# Patient Record
Sex: Female | Born: 1995 | Race: White | Hispanic: No | Marital: Married | Smoking: Current every day smoker
Health system: Southern US, Community
[De-identification: ages and names within clinical notes are randomized; demographics above are authoritative.]

## PROBLEM LIST (undated history)

## (undated) DIAGNOSIS — F32A Depression, unspecified: Secondary | ICD-10-CM

## (undated) DIAGNOSIS — M419 Scoliosis, unspecified: Secondary | ICD-10-CM

## (undated) DIAGNOSIS — F419 Anxiety disorder, unspecified: Secondary | ICD-10-CM

## (undated) HISTORY — PX: WISDOM TOOTH EXTRACTION: SHX21

## (undated) HISTORY — DX: Anxiety disorder, unspecified: F41.9

## (undated) HISTORY — DX: Depression, unspecified: F32.A

## (undated) HISTORY — PX: DENTAL SURGERY: SHX609

## (undated) HISTORY — DX: Scoliosis, unspecified: M41.9

---
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
## 2013-01-23 ENCOUNTER — Ambulatory Visit: Payer: Medicaid Other | Admitting: Family Medicine

## 2013-02-06 ENCOUNTER — Emergency Department (HOSPITAL_COMMUNITY)
Admission: EM | Admit: 2013-02-06 | Discharge: 2013-02-06 | Disposition: A | Discharge status: Home / Self Care | Payer: Medicaid Other | Attending: Emergency Medicine | Admitting: Emergency Medicine

## 2013-02-06 ENCOUNTER — Emergency Department (HOSPITAL_COMMUNITY): Payer: Medicaid Other

## 2013-02-06 ENCOUNTER — Encounter (HOSPITAL_COMMUNITY): Payer: Self-pay

## 2013-02-06 DIAGNOSIS — S93602A Unspecified sprain of left foot, initial encounter: Secondary | ICD-10-CM

## 2013-02-06 DIAGNOSIS — Y939 Activity, unspecified: Secondary | ICD-10-CM | POA: Insufficient documentation

## 2013-02-06 DIAGNOSIS — S93409A Sprain of unspecified ligament of unspecified ankle, initial encounter: Secondary | ICD-10-CM | POA: Insufficient documentation

## 2013-02-06 DIAGNOSIS — Z8739 Personal history of other diseases of the musculoskeletal system and connective tissue: Secondary | ICD-10-CM | POA: Insufficient documentation

## 2013-02-06 DIAGNOSIS — X500XXA Overexertion from strenuous movement or load, initial encounter: Secondary | ICD-10-CM | POA: Insufficient documentation

## 2013-02-06 DIAGNOSIS — Y929 Unspecified place or not applicable: Secondary | ICD-10-CM | POA: Insufficient documentation

## 2013-02-06 NOTE — ED Notes (Signed)
Twisted lt foot when going down steps on Sunday.  Swelling present.  Says ankle was swollen , but is better. Ankle is nontender at present.

## 2013-02-06 NOTE — ED Notes (Signed)
Pt reports stepping from a step "wrong" 2 days ago and injured her left foot.  Swelling to the ankle and top of her foot.

## 2013-02-10 NOTE — ED Provider Notes (Signed)
  CSN: [id number]     Arrival date & time 02/06/13  1418 History     First MD Initiated Contact with Patient 02/06/13 1537     Chief Complaint  Patient presents with  . Foot Pain   (Consider location/radiation/quality/duration/timing/severity/associated sxs/prior Treatment) HPI Comments: Tammy Parks is a 17 y.o. Female presenting with pain across her foot after tripping and twisting the foot and ankle 2 days ago.  She has applied ice and has tried to elevate the extremity but with persistent pain and swelling.  She denies radiation of pain which is constant and sore with weight bearing and touch.  She denies other injury.     The history is provided by the patient.    Past Medical History  Diagnosis Date  . Scoliosis    History reviewed. No pertinent past surgical history. No family history on file. History  Substance Use Topics  . Smoking status: Never Smoker   . Smokeless tobacco: Not on file  . Alcohol Use: No   OB History   Grav Para Term Preterm Abortions TAB SAB Ect Mult Living                 Review of Systems  Musculoskeletal: Positive for joint swelling and arthralgias.  Skin: Negative for wound.  Neurological: Negative for weakness and numbness.    Allergies  Review of patient's allergies indicates no known allergies.  Home Medications   Current Outpatient Rx  Name  Route  Sig  Dispense  Refill  . etonogestrel (NEXPLANON) 68 MG IMPL implant   Subcutaneous   Inject 1 each into the skin once.         Marland Kitchen ibuprofen (ADVIL,MOTRIN) 200 MG tablet   Oral   Take 200 mg by mouth every 6 (six) hours as needed for pain.          BP 131/81  Pulse 70  Temp(Src) 98.4 F (36.9 C) (Oral)  Resp 16  Ht 5\' 2"  (1.575 m)  Wt 115 lb (52.164 kg)  BMI 21.03 kg/m2  SpO2 100% Physical Exam  Nursing note and vitals reviewed. Constitutional: She appears well-developed and well-nourished.  HENT:  Head: Normocephalic.  Cardiovascular: Normal rate and intact  distal pulses.  Exam reveals no decreased pulses.   Pulses:      Dorsalis pedis pulses are 2+ on the right side, and 2+ on the left side.       Posterior tibial pulses are 2+ on the right side, and 2+ on the left side.  Musculoskeletal: She exhibits edema and tenderness.       Left foot: She exhibits bony tenderness and swelling. She exhibits normal capillary refill, no crepitus and no deformity.       Feet:  No ankle mortise pain,  nontender at lateral and medial malleolus.  Most tender midfoot with edema appreciated at this site.  Achilles intact,  nontender.  Neurological: She is alert. No sensory deficit.  Skin: Skin is warm, dry and intact.    ED Course   Procedures (including critical care time)  Labs Reviewed - No data to display No results found. 1. Foot sprain, left, initial encounter     MDM  Patients labs and/or radiological studies were viewed and considered during the medical decision making and disposition process. Pt was encouraged to continue RICE, given crutches,  aso for support.  Plan f/u with pcp in 1 week for recheck if not improving.  Burgess Amor, PA-C 02/10/13 [phone]

## 2013-02-12 NOTE — ED Provider Notes (Signed)
Medical screening examination/treatment/procedure(s) were performed by non-physician practitioner and as supervising physician I was immediately available for consultation/collaboration. Devoria Albe, MD, FACEP   Ward Givens, MD 02/12/13 [phone]

## 2013-03-14 ENCOUNTER — Encounter: Payer: Self-pay | Admitting: Family Medicine

## 2013-03-14 ENCOUNTER — Other Ambulatory Visit (HOSPITAL_COMMUNITY)
Admission: RE | Admit: 2013-03-14 | Discharge: 2013-03-14 | Disposition: A | Discharge status: Home / Self Care | Payer: Medicaid Other | Source: Ambulatory Visit | Attending: Family Medicine | Admitting: Family Medicine

## 2013-03-14 ENCOUNTER — Ambulatory Visit (INDEPENDENT_AMBULATORY_CARE_PROVIDER_SITE_OTHER): Payer: Medicaid Other | Admitting: Family Medicine

## 2013-03-14 VITALS — BP 126/73 | HR 53 | Temp 98.1°F | Wt 120.0 lb

## 2013-03-14 DIAGNOSIS — Z2089 Contact with and (suspected) exposure to other communicable diseases: Secondary | ICD-10-CM

## 2013-03-14 DIAGNOSIS — N898 Other specified noninflammatory disorders of vagina: Secondary | ICD-10-CM | POA: Insufficient documentation

## 2013-03-14 DIAGNOSIS — M419 Scoliosis, unspecified: Secondary | ICD-10-CM

## 2013-03-14 DIAGNOSIS — Z113 Encounter for screening for infections with a predominantly sexual mode of transmission: Secondary | ICD-10-CM | POA: Insufficient documentation

## 2013-03-14 DIAGNOSIS — M4120 Other idiopathic scoliosis, site unspecified: Secondary | ICD-10-CM

## 2013-03-14 DIAGNOSIS — Z202 Contact with and (suspected) exposure to infections with a predominantly sexual mode of transmission: Secondary | ICD-10-CM

## 2013-03-14 LAB — POCT WET PREP (WET MOUNT): Clue Cells Wet Prep Whiff POC: POSITIVE

## 2013-03-14 LAB — POCT URINE PREGNANCY: Preg Test, Ur: NEGATIVE

## 2013-03-14 NOTE — Assessment & Plan Note (Signed)
Patient with previous referral to sports medicine at [facility] for evaluation. Was unable to make the appointment. Will make her another appointment.

## 2013-03-14 NOTE — Assessment & Plan Note (Addendum)
Patient with BV, no STIs. Will treat with flagyl. Also wants nexplanon out, will have return for removal within the next week. Given contraceptive handout to choose from.

## 2013-03-14 NOTE — Progress Notes (Signed)
  Subjective:    Patient ID: Tammy Parks, female    DOB: 1995-12-04, 17 y.o.   MRN: [id number]  Vaginal Discharge The patient's primary symptoms include a vaginal discharge.    VAGINAL DISCHARGE  Onset: for the past few weeks Description: white  Odor: no Itching: no  Symptoms Dysuria: no  Bleeding: yes, has been constant since got explanon placed  Pelvic pain: yes, feels like a period cramp  Back pain: no  Fever: no  Genital sores: no  Rash: no  Dyspareunia: yes, right when they initiate intercourse  No use of condoms. Last sexually active a couple of days ago.  Red Flags: Missed period: no, last LMP last week Pregnancy: no  Recent antibiotics: no  Sexual activity: yes  Possible STD exposure: states partner got tested and everything was ok  IUD: no  Diabetes: no   Scoliosis: pain getting worse in the area of her curvature. Has been doing the exercises that Dr. Hulen Luster recommended and doesn't think they are helping. Was supposed to go see ortho at [facility], but didn't go b/c of work obligation. States usually does not hurt unless has been standing for a long time.   Review of Systems  Genitourinary: Positive for vaginal discharge.   see HPI     Objective:   Physical Exam  Constitutional: She appears well-developed and well-nourished.  HENT:  Head: Normocephalic and atraumatic.  Cardiovascular: Normal rate, regular rhythm and normal heart sounds.   Pulmonary/Chest: Effort normal and breath sounds normal.  Abdominal: Soft. She exhibits no distension. There is no tenderness. There is no rebound and no guarding.  Genitourinary: Uterus normal. Vaginal discharge (white, creamy) found.  No lesions, no bleeding, minimal white creamy discharge noted  Neurological: She is alert.  Skin: Skin is warm and dry.  BP 126/73  Pulse 53  Temp(Src) 98.1 F (36.7 C) (Oral)  Wt 120 lb (54.432 kg)  BMI 21.94 kg/m2  LMP 03/05/2013     Assessment & Plan:

## 2013-03-14 NOTE — Patient Instructions (Signed)
Nice to meet you today. We will call with the results of your tests. We will also schedule an appointment for you to be seen by the orthopedic doctors at [facility] as Dr. Hulen Luster wanted. We will also see you back soon for removal of your nexplanon.

## 2013-03-15 MED ORDER — METRONIDAZOLE 500 MG PO TABS: 500.0000 mg | ORAL_TABLET | Freq: Two times a day (BID) | ORAL | Status: DC

## 2013-03-15 NOTE — Addendum Note (Signed)
Addended by: Glori Luis on: 03/15/2013 08:34 AM   Modules accepted: Orders

## 2013-03-30 ENCOUNTER — Ambulatory Visit: Payer: Medicaid Other | Admitting: Family Medicine

## 2013-08-31 ENCOUNTER — Encounter: Payer: Self-pay | Admitting: Family Medicine

## 2013-08-31 ENCOUNTER — Telehealth: Payer: Self-pay | Admitting: *Deleted

## 2013-08-31 ENCOUNTER — Ambulatory Visit (INDEPENDENT_AMBULATORY_CARE_PROVIDER_SITE_OTHER): Payer: 59 | Admitting: Family Medicine

## 2013-08-31 VITALS — BP 123/76 | HR 67 | Temp 98.4°F | Ht 62.0 in | Wt 114.0 lb

## 2013-08-31 DIAGNOSIS — R55 Syncope and collapse: Secondary | ICD-10-CM

## 2013-08-31 DIAGNOSIS — R21 Rash and other nonspecific skin eruption: Secondary | ICD-10-CM

## 2013-08-31 MED ORDER — MUPIROCIN 2 % EX OINT: 1.0000 "application " | TOPICAL_OINTMENT | Freq: Two times a day (BID) | CUTANEOUS | Status: DC

## 2013-08-31 MED ORDER — TRIAMCINOLONE ACETONIDE 0.05 % EX OINT: TOPICAL_OINTMENT | CUTANEOUS | Status: DC

## 2013-08-31 NOTE — Telephone Encounter (Signed)
[facility]amcinol[facility]int.  The Rx was prescribed for triamcinolone 0.05% oint, per [facility] it only comes in 0.5%.  Call [facility] at [phone] to clarify the strength of this oint.  Clovis PuMartin, Renee Erb L, RN

## 2013-08-31 NOTE — Assessment & Plan Note (Signed)
Likely contact dermatitis w/ possible impetigo Pt to stop wearing belt buckles, and use hypoallergenic lotions and soaps Diffuse involvement of arms and legs is mild but may be from overall inflammatory reaction Kenolog .05 oint Mupirocin F/u in 2 wks

## 2013-08-31 NOTE — Progress Notes (Signed)
Tammy Parks is a 18 y.o. female who [facility] for [facility]zy spells and rash  Dizzy spells: happened 2 days ago for the first time. Felt hot and sweatty while wearing jacket in house. Took off jacket and 15 min later felt vertigo and room went white. Friend helped pt to lie down. Gave crackers and soda and resolved after 30 min of rest.  Went to gym the night before and only had water and chicken noodle soup that night and then just water in the morning. Episode happened around 12o'clock in the afternoon. Pt drinks more than 12 cans or 3 2 L bottles of Mtn Dew per day.   Rash: present for 4 mo. Bleeding w/ scratching. Getting bigger. Denies any changes in soaps detergents, lotions. Tried using Hydrocortisone 2.5% cream from 2011 w/o benefit. Present on bellyk , elbows and both legs. Itchy.    The following portions of the patient's history were reviewed and updated as appropriate: allergies, current medications, past medical history, family and social history, and problem list.  Patient is a nonsmoker.  Past Medical History  Diagnosis Date  . Scoliosis     ROS as above otherwise neg.    Medications reviewed. Current Outpatient Prescriptions  Medication Sig Dispense Refill  . etonogestrel (NEXPLANON) 68 MG IMPL implant Inject 1 each into the skin once.      Marland Kitchen. ibuprofen (ADVIL,MOTRIN) 200 MG tablet Take 200 mg by mouth every 6 (six) hours as needed for pain.      . metroNIDAZOLE (FLAGYL) 500 MG tablet Take 1 tablet (500 mg total) by mouth 2 (two) times daily.  14 tablet  0  . mupirocin ointment (BACTROBAN) 2 % Place 1 application into the nose 2 (two) times daily.  22 g  0  . TRIAMCINOLONE ACETONIDE, TOP, 0.05 % OINT Apply topically twice daily for 1-2 weeks  85 g  0   No current facility-administered medications for this visit.    Exam:  BP 123/76  Pulse 67  Temp(Src) 98.4 F (36.9 C) (Oral)  Ht 5\' 2"  (1.575 m)  Wt 114 lb (51.71 kg)  BMI 20.85 kg/m2 Gen: Well NAD HEENT:  EOMI,  MMM Lungs: CTABL Nl WOB Heart: RRR no MRG Abd: NABS, NT, ND Exts: Non edematous BL  LE, warm and well perfused.  Skin: extensive skin breakdown around the belt line of the belly w/ small amount of honey crusting. Diffuse mild macular papular rash on arms and legs.  Neuro: CN 2-12 intact, rhomberg neg. Ambulation nml   No results found for this or any previous visit (from the past 72 hour(s)).  A/P (as seen in Problem list)  Skin rash Likely contact dermatitis w/ possible impetigo Pt to stop wearing belt buckles, and use hypoallergenic lotions and soaps Diffuse involvement of arms and legs is mild but may be from overall inflammatory reaction Kenolog .05 oint Mupirocin F/u in 2 wks   Near syncope Likely secondary to vasovagal response from stress, poor nutrition, and caffeine withdrawal  No evidence of cardiac arrythmia, szr, or medication involvement Pt to eat more balanced meals Pt to start cutting back on Mtn Dew F/u PRN

## 2013-08-31 NOTE — Patient Instructions (Signed)
You are doing well overall The skin rash is likely an allergic reaction that has become infected Please use the new steroid ointment on the rash for 1-2 weeks until it has resolved Please use the mupriocin ointment for the belly to clear up an infection Please remember to cut back on the mtn Dew and improve your overall nutrition Please come back in 2 weeks or sooner if needed.

## 2013-08-31 NOTE — Assessment & Plan Note (Signed)
Likely secondary to vasovagal response from stress, poor nutrition, and caffeine withdrawal  No evidence of cardiac arrythmia, szr, or medication involvement Pt to eat more balanced meals Pt to start cutting back on Mtn Dew F/u PRN

## 2013-09-14 ENCOUNTER — Ambulatory Visit: Payer: 59 | Admitting: Family Medicine

## 2014-02-22 ENCOUNTER — Encounter: Payer: Self-pay | Admitting: Family Medicine

## 2014-02-22 ENCOUNTER — Telehealth: Payer: Self-pay | Admitting: *Deleted

## 2014-02-22 ENCOUNTER — Ambulatory Visit (INDEPENDENT_AMBULATORY_CARE_PROVIDER_SITE_OTHER): Payer: 59 | Admitting: Family Medicine

## 2014-02-22 ENCOUNTER — Other Ambulatory Visit (HOSPITAL_COMMUNITY)
Admission: RE | Admit: 2014-02-22 | Discharge: 2014-02-22 | Disposition: A | Discharge status: Home / Self Care | Payer: 59 | Source: Ambulatory Visit | Attending: Family Medicine | Admitting: Family Medicine

## 2014-02-22 VITALS — BP 118/72 | HR 72 | Temp 98.5°F | Wt 116.0 lb

## 2014-02-22 DIAGNOSIS — N898 Other specified noninflammatory disorders of vagina: Secondary | ICD-10-CM

## 2014-02-22 DIAGNOSIS — Z113 Encounter for screening for infections with a predominantly sexual mode of transmission: Secondary | ICD-10-CM | POA: Insufficient documentation

## 2014-02-22 DIAGNOSIS — R300 Dysuria: Secondary | ICD-10-CM

## 2014-02-22 LAB — POCT UA - MICROSCOPIC ONLY

## 2014-02-22 LAB — POCT URINALYSIS DIPSTICK
BILIRUBIN UA: NEGATIVE
Blood, UA: NEGATIVE
GLUCOSE UA: NEGATIVE
Ketones, UA: NEGATIVE
NITRITE UA: NEGATIVE
Protein, UA: NEGATIVE
SPEC GRAV UA: 1.02
UROBILINOGEN UA: 0.2
pH, UA: 7

## 2014-02-22 LAB — POCT WET PREP (WET MOUNT): CLUE CELLS WET PREP WHIFF POC: POSITIVE

## 2014-02-22 MED ORDER — METRONIDAZOLE 500 MG PO TABS: 500.0000 mg | ORAL_TABLET | Freq: Two times a day (BID) | ORAL | Status: DC

## 2014-02-22 MED ORDER — FLUCONAZOLE 150 MG PO TABS: 150.0000 mg | ORAL_TABLET | Freq: Once | ORAL | Status: DC

## 2014-02-22 NOTE — Telephone Encounter (Signed)
Pt is aware of this. Billijo Dilling,CMA  

## 2014-02-22 NOTE — Patient Instructions (Signed)
Nice to see you. We will call with the results. If you develop fever, nausea, vomiting, worsening pain, or worsening discharge please seek medical attention.

## 2014-02-22 NOTE — Telephone Encounter (Signed)
Message copied by Henri MedalHARTSELL, JAZMIN M on Fri Feb 22, 2014 11:18 AM ------      Message from: Birdie SonsSONNENBERG, ERIC G      Created: Fri Feb 22, 2014 11:09 AM       Patient appears to have BV and yeast infection. I sent in antibiotics for the patient. I attempted to call to let her know the results and there was no answer. Could you please contact the patient to inform her of these results. Thanks. ------

## 2014-02-22 NOTE — Progress Notes (Signed)
Patient ID: Tammy Parks, female   DOB: 08-Sep-1995, 18 y.o.   MRN: [id number]  Marikay AlarEric Sonnenberg, MD Phone: [phone]  Tammy Parks is a 18 y.o. female who presents today for same day appointment.  Vaginal discharge: for the past 2 months. She notes this is thick and white. Notes she has pain with sex that is worst with insertion though is uncomfortable during intercourse. No abdominal pain. No fever or chilld. She has on sexual partner and they do not use condoms. No history of STDs. No vaginal bleeding.  Patient is a smoker.  ROS: Per HPI   Physical Exam Filed Vitals:   02/22/14 1017  BP: 118/72  Pulse: 72  Temp: 98.5 F (36.9 C)     Gen: Well NAD HEENT: PERRL,  MMM Lungs: CTABL Nl WOB Heart: RRR no MRG Abd: soft, NT, ND GU: mild irritation of labia, normal vaginal mucosa, normal appearing cervix, there is thick white discharge in vaginal vault, no discharge from the cervical os, there was discomfort on insertion of fingers for bimanual exam Exts: Non edematous BL  LE, warm and well perfused.    Assessment/Plan: Please see individual problem list.

## 2014-02-23 NOTE — Assessment & Plan Note (Signed)
Patient with BV and yeast infection on wet prep. Will treat with flagyl and diflucan. Will await results of GC/chlamydia and will inform her of the results. Given return precautions. F/u prn or if not improving.

## 2014-03-04 NOTE — Progress Notes (Signed)
Pt is aware of results. Maggi Hershkowitz,CMA  

## 2014-05-27 ENCOUNTER — Telehealth: Payer: Self-pay | Admitting: Family Medicine

## 2014-05-27 NOTE — Telephone Encounter (Signed)
Wants to know when her birth control runs out

## 2014-05-27 NOTE — Telephone Encounter (Signed)
Pt is aware of placement 10/2011.  She will be due for it to be removed 10/2014. Zaccai Chavarin,CMA

## 2014-08-08 ENCOUNTER — Ambulatory Visit (INDEPENDENT_AMBULATORY_CARE_PROVIDER_SITE_OTHER): Payer: 59 | Admitting: Family Medicine

## 2014-08-08 VITALS — BP 134/72 | HR 102 | Temp 98.1°F | Ht 62.0 in | Wt 113.7 lb

## 2014-08-08 DIAGNOSIS — Z3046 Encounter for surveillance of implantable subdermal contraceptive: Secondary | ICD-10-CM

## 2014-08-08 DIAGNOSIS — Z30017 Encounter for initial prescription of implantable subdermal contraceptive: Secondary | ICD-10-CM

## 2014-08-08 DIAGNOSIS — Z308 Encounter for other contraceptive management: Secondary | ICD-10-CM

## 2014-08-08 LAB — POCT URINE PREGNANCY: PREG TEST UR: NEGATIVE

## 2014-08-08 MED ORDER — MEDROXYPROGESTERONE ACETATE 150 MG/ML IM SUSP
150.0000 mg | Freq: Once | INTRAMUSCULAR | Status: AC
  Administered 2014-08-08: 150 mg via INTRAMUSCULAR

## 2014-08-08 NOTE — Addendum Note (Signed)
Addended by: Clovis PuMARTIN, TAMIKA L on: 08/08/2014 09:39 AM   Modules accepted: Orders

## 2014-08-08 NOTE — Patient Instructions (Signed)
Please make sure to use a back method of birth control (eg condoms) for next week.  You will need to return every 3 months for depo shot.  You may remove dressing and bathe normally after 24 hours.   Wound Care Wound care helps prevent pain and infection.  You may need a tetanus shot if:  You cannot remember when you had your last tetanus shot.  You have never had a tetanus shot.  The injury broke your skin. If you need a tetanus shot and you choose not to have one, you may get tetanus. Sickness from tetanus can be serious. HOME CARE   Only take medicine as told by your doctor.  Clean the wound daily with mild soap and water.  Change any bandages (dressings) as told by your doctor.  Put medicated cream and a bandage on the wound as told by your doctor.  Change the bandage if it gets wet, dirty, or starts to smell.  Take showers. Do not take baths, swim, or do anything that puts your wound under water.  Rest and raise (elevate) the wound until the pain and puffiness (swelling) are better.  Keep all doctor visits as told. GET HELP RIGHT AWAY IF:   Yellowish-white fluid (pus) comes from the wound.  Medicine does not lessen your pain.  There is a red streak going away from the wound.  You have a fever. MAKE SURE YOU:   Understand these instructions.  Will watch your condition.  Will get help right away if you are not doing well or get worse. Document Released: 04/13/2008 Document Revised: 09/27/2011 Document Reviewed: 11/08/2010 [facility] Patient Information 2015 [place], [place]. This information is not intended to replace advice given to you by your health care provider. Make sure you discuss any questions you have with your health care provider.

## 2014-08-08 NOTE — Progress Notes (Signed)
Patient ID: Minna AntisSamantha A Lynn, female   DOB: [birth year], 19 y.o.   MRN: [id number]   HPI: Here for Nexplanon removal, she had it placed 10/2011.  ALLERGIES  No Known Allergies      Yes (specify below) ALLERGY REACTION  __________________________________ __________________________________  __________________________________ __________________________________  __________________________________ __________________________________    REASONS FOR REMOVAL  Chief Complaint:   Vaginal bleeding       Heavy bleeding    Other:Time to remove Nexplanon  Secondary Complaints:Will like to start Depo shot.  Removal Approved By:PCP   The patient's R arm was palpated and the implant device located. The area was prepped with Betadine. The distal end of the device was palpated and 3 cc of 1% lidocaine with epinephrine was injected. A 1-2 mm incision was made. Any fibrotic tissue was carefully dissected away using blunt  dissection. The device was removed in an intact manner. Dermabond and a sterile dressing were applied to the incision. The patient tolerated the procedure well.  New contraceptive method: Depo given today.  Procedure performed by : Dr Nadine CountsGottschalk. Assistant: Dr Lum BabeEniola.

## 2014-08-08 NOTE — Progress Notes (Signed)
  Pt in for Depo Provera injection.  Pt tolerated Depo injection. Depo given left upper outer quadrant.  Next injection due April 8-November 08, 2014.  Reminder card given. Clovis PuMartin, Phallon Haydu L, RN

## 2014-10-29 ENCOUNTER — Ambulatory Visit (INDEPENDENT_AMBULATORY_CARE_PROVIDER_SITE_OTHER): Payer: 59 | Admitting: *Deleted

## 2014-10-29 DIAGNOSIS — Z3042 Encounter for surveillance of injectable contraceptive: Secondary | ICD-10-CM | POA: Diagnosis not present

## 2014-10-29 MED ORDER — MEDROXYPROGESTERONE ACETATE 150 MG/ML IM SUSP
150.0000 mg | Freq: Once | INTRAMUSCULAR | Status: AC
  Administered 2014-10-29: 150 mg via INTRAMUSCULAR

## 2014-10-29 NOTE — Progress Notes (Signed)
   Pt in for Depo Provera injection.  Pt tolerated Depo injection. Depo given right upper outer quadrant.  Next injection due June 25-January 28, 2015.  Reminder card given. Clovis PuMartin, Tamika L, RN

## 2015-01-14 ENCOUNTER — Ambulatory Visit (INDEPENDENT_AMBULATORY_CARE_PROVIDER_SITE_OTHER): Payer: 59 | Admitting: *Deleted

## 2015-01-14 DIAGNOSIS — Z3042 Encounter for surveillance of injectable contraceptive: Secondary | ICD-10-CM

## 2015-01-14 MED ORDER — MEDROXYPROGESTERONE ACETATE 150 MG/ML IM SUSP
150.0000 mg | Freq: Once | INTRAMUSCULAR | Status: AC
  Administered 2015-01-14: 150 mg via INTRAMUSCULAR

## 2015-01-14 NOTE — Progress Notes (Signed)
   Pt in for Depo Provera injection.  Pt tolerated Depo injection. Depo given left upper outer quadrant.  Next injection due Sept 13-Sept 27, 2016.  Pt has been bleeding continuously since last injection given.  Precept with Dr. Perley JainMcDiarmid; pt need to be seen by a provider for further evaluation.  Appt with PCP 01/15/15 at 3:15 PM.  Pt will like to continue with Depo Provera. Reminder card given. Clovis PuMartin, Yatzary Merriweather L, RN

## 2015-01-15 ENCOUNTER — Other Ambulatory Visit (HOSPITAL_COMMUNITY)
Admission: RE | Admit: 2015-01-15 | Discharge: 2015-01-15 | Disposition: A | Discharge status: Home / Self Care | Payer: 59 | Source: Ambulatory Visit | Attending: Family Medicine | Admitting: Family Medicine

## 2015-01-15 ENCOUNTER — Encounter: Payer: Self-pay | Admitting: Family Medicine

## 2015-01-15 ENCOUNTER — Ambulatory Visit (INDEPENDENT_AMBULATORY_CARE_PROVIDER_SITE_OTHER): Payer: 59 | Admitting: Family Medicine

## 2015-01-15 VITALS — BP 123/81 | HR 89 | Temp 98.4°F | Ht 62.0 in | Wt 114.0 lb

## 2015-01-15 DIAGNOSIS — N926 Irregular menstruation, unspecified: Secondary | ICD-10-CM

## 2015-01-15 DIAGNOSIS — R55 Syncope and collapse: Secondary | ICD-10-CM

## 2015-01-15 DIAGNOSIS — R42 Dizziness and giddiness: Secondary | ICD-10-CM

## 2015-01-15 DIAGNOSIS — N939 Abnormal uterine and vaginal bleeding, unspecified: Secondary | ICD-10-CM

## 2015-01-15 DIAGNOSIS — Z113 Encounter for screening for infections with a predominantly sexual mode of transmission: Secondary | ICD-10-CM | POA: Insufficient documentation

## 2015-01-15 LAB — POCT WET PREP (WET MOUNT): Clue Cells Wet Prep Whiff POC: NEGATIVE

## 2015-01-15 LAB — POCT URINE PREGNANCY: PREG TEST UR: NEGATIVE

## 2015-01-15 NOTE — Patient Instructions (Addendum)
Nice to see you.  Your bleeding is likely related to the depo provera. We are going to check some labs and will call with the results.  Please drink plenty of water. Decrease caffeine intake.  If you develop further symptoms of dizziness, have chest pain, trouble breathing, continue to bleed, get light headed or have palpitations please seek medical attention.

## 2015-01-15 NOTE — Assessment & Plan Note (Addendum)
Given prodrome of chills suspect this is a vasovagal reaction. Orthostatics negative. Dix halpike negative. Neurologically intact. No cardiac symptoms with this so doubt arrhythmia as a cause. Advised on continuing to cut back on caffeine. Advised to drink an adequate amount of water. Will check BMET to evaluate for glucose and electrolyte abnormalities. Given return precautions. Will continue to monitor. If recurs she will follow-up.

## 2015-01-15 NOTE — Assessment & Plan Note (Signed)
Patient with several months of intermittent uterine bleeding likely related to recent start of depo provera. She is not bleeding at this time. HR stable so doubt she is anemic. No tenderness on exam. No appreciated lesions on exam. Given likely old blood that was dark in appearance in vagina wet prep and GC/chlamydia checked to rule out infectious cause of this material. Doubt infection, though patient sexually active with no condom use. U preg negative. Will check CBC. Await lab results. Given return precautions.

## 2015-01-15 NOTE — Progress Notes (Addendum)
Patient ID: Tammy Parks, female   DOB: January 02, 1996, 19 y.o.   MRN: [id number]  Tammy AlarEric Makinzi Prieur, MD Phone: [phone]  Tammy Parks is a 19 y.o. female who presents today for f/u.  Abnormal uterine bleeding: patient notes that she has had abnormal bleeding for the past 3 months. She notes starting depo 6 months ago. After the first shot she had no bleeding for 3 months. After the 2nd shot she had intermittent bleeding alternating between normal periods and spotting. Had bleeding most days for 3 months. She was seen yesterday for 3rd injection and notes that she had decreased bleeding yesterday and no bleeding today. She notes bleeding was dark in nature like clots. She denies abdominal pain with this. She is sexually active with one partner and does not use condoms. Has had similar issues in the past when she had nexplanon in place, though the bleeding was worse at that time. Has not had normal periods since the nexplanon was placed 4 years ago.  Vertigo: patient notes one episode of vertigo one month ago. Describes as the room spinning around her. Lasted for about 10 minutes. No chest pain, shortness of breath, palpitations, numbness, weakness, or neurological changes with this. Notes she does get chills prior to this. Occurred when sitting down. Resolved on its own. Notes similar episode several years ago. Notes she has decreased caffeine intake, though still drink some caffeine. Does not drink much water.   PMH: nonsmoker.   ROS: Per HPI   Physical Exam Filed Vitals:   01/15/15 1524  BP: 123/81  Pulse: 89  Temp: 98.4 F (36.9 C)    Gen: Well NAD HEENT: PERRL,  MMM, normal TMs bilaterally, normal OP Lungs: CTABL Nl WOB Heart: RRR, no murmur appreciated Abd: soft, NT, ND GU: normal labia, normal vaginal mucosa, there is old blood noted in the vagina, no blood from the cervical os, no defects seen on speculum exam, no vaginal wall defects palpated on bimanual exam, no adnexal  masses or tenderness appreciated on exam Neuro: CN 2-12 intact, 5/5 strength in bilateral biceps, triceps, grip, quads, hamstrings, plantar and dorsiflexion, sensation to light touch intact in bilateral UE and LE, normal gait, 2+ patellar reflexes Exts: Non edematous BL  LE, warm and well perfused.    Assessment/Plan: Please see individual problem list.  Near syncope Given prodrome of chills suspect this is a vasovagal reaction. Orthostatics negative. Dix halpike negative. Neurologically intact. No cardiac symptoms with this so doubt arrhythmia as a cause. Advised on continuing to cut back on caffeine. Advised to drink an adequate amount of water. Will check BMET to evaluate for glucose and electrolyte abnormalities. Given return precautions. Will continue to monitor. If recurs she will follow-up.  Abnormal uterine bleeding Patient with several months of intermittent uterine bleeding likely related to recent start of depo provera. She is not bleeding at this time. HR stable so doubt she is anemic. No tenderness on exam. No appreciated lesions on exam. Given likely old blood that was dark in appearance in vagina wet prep and GC/chlamydia checked to rule out infectious cause of this material. Doubt infection, though patient sexually active with no condom use. U preg negative. Will check CBC. Await lab results. Given return precautions.     Orders Placed This Encounter  Procedures  . CBC  . Basic Metabolic Panel  . POCT urine pregnancy  . POCT Wet Prep [facility] [place])    Tammy AlarEric Jaxn Chiquito, MD [facility] PGY[facility]

## 2015-01-16 ENCOUNTER — Telehealth: Payer: Self-pay | Admitting: Family Medicine

## 2015-01-16 DIAGNOSIS — B379 Candidiasis, unspecified: Secondary | ICD-10-CM

## 2015-01-16 LAB — BASIC METABOLIC PANEL
BUN: 11 mg/dL (ref 6–23)
CALCIUM: 9.1 mg/dL (ref 8.4–10.5)
CO2: 24 mEq/L (ref 19–32)
CREATININE: 0.82 mg/dL (ref 0.50–1.10)
Chloride: 108 mEq/L (ref 96–112)
GLUCOSE: 82 mg/dL (ref 70–99)
POTASSIUM: 3.9 meq/L (ref 3.5–5.3)
Sodium: 142 mEq/L (ref 135–145)

## 2015-01-16 LAB — CBC
HCT: 43.8 % (ref 36.0–46.0)
HEMOGLOBIN: 15.3 g/dL — AB (ref 12.0–15.0)
MCH: 31 pg (ref 26.0–34.0)
MCHC: 34.9 g/dL (ref 30.0–36.0)
MCV: 88.7 fL (ref 78.0–100.0)
MPV: 9.6 fL (ref 8.6–12.4)
Platelets: 266 10*3/uL (ref 150–400)
RBC: 4.94 MIL/uL (ref 3.87–5.11)
RDW: 12.7 % (ref 11.5–15.5)
WBC: 6.6 10*3/uL (ref 4.0–10.5)

## 2015-01-16 LAB — CERVICOVAGINAL ANCILLARY ONLY
Chlamydia: NEGATIVE
Neisseria Gonorrhea: NEGATIVE

## 2015-01-16 MED ORDER — FLUCONAZOLE 150 MG PO TABS: 150.0000 mg | ORAL_TABLET | ORAL | Status: DC

## 2015-01-16 NOTE — Telephone Encounter (Signed)
Attempted to call the patient. Went straight to VM. Left message asking her to call back to the office. When she call back please advise her that her lab results showed a yeast infection. No other infections present. I will send in a prescription for diflucan.

## 2015-01-17 MED ORDER — FLUCONAZOLE 150 MG PO TABS: 150.0000 mg | ORAL_TABLET | ORAL | Status: DC

## 2015-01-17 NOTE — Telephone Encounter (Signed)
Spoke with patient and she is aware of results.  She would like this medication resent to [facility] in [place].  Informed her that this has been done and she can pick up later today.  Genene Kilman,CMA

## 2015-01-17 NOTE — Addendum Note (Signed)
Addended by: Henri MedalHARTSELL, Sian Rockers M on: 01/17/2015 10:09 AM   Modules accepted: Orders

## 2015-02-13 ENCOUNTER — Ambulatory Visit: Payer: 59 | Admitting: Family Medicine

## 2015-04-08 ENCOUNTER — Ambulatory Visit: Payer: 59

## 2015-09-16 ENCOUNTER — Ambulatory Visit (INDEPENDENT_AMBULATORY_CARE_PROVIDER_SITE_OTHER): Payer: 59 | Admitting: Family Medicine

## 2015-09-16 ENCOUNTER — Encounter: Payer: Self-pay | Admitting: Family Medicine

## 2015-09-16 VITALS — BP 135/78 | HR 94 | Temp 97.7°F | Ht 62.0 in | Wt 112.0 lb

## 2015-09-16 DIAGNOSIS — H00016 Hordeolum externum left eye, unspecified eyelid: Secondary | ICD-10-CM | POA: Diagnosis not present

## 2015-09-16 NOTE — Patient Instructions (Signed)
Continue warm compresses. I don't think you need an antibiotic right now, If gets worse or vision changes come back to see [place]  Be well, Dr. Ellison Carwin A stye is a bump on your eyelid caused by a bacterial infection. A stye can form inside the eyelid (internal stye) or outside the eyelid (external stye). An internal stye may be caused by an infected oil-producing gland inside your eyelid. An external stye may be caused by an infection at the base of your eyelash (hair follicle). Styes are very common. Anyone can get them at any age. They usually occur in just one eye, but you may have more than one in either eye.  CAUSES  The infection is almost always caused by bacteria called Staphylococcus aureus. This is a common type of bacteria that lives on your skin. RISK FACTORS You may be at higher risk for a stye if you have had one before. You may also be at higher risk if you have:  Diabetes.  Long-term illness.  Long-term eye redness.  A skin condition called seborrhea.  High fat levels in your blood (lipids). SIGNS AND SYMPTOMS  Eyelid pain is the most common symptom of a stye. Internal styes are more painful than external styes. Other signs and symptoms may include:  Painful swelling of your eyelid.  A scratchy feeling in your eye.  Tearing and redness of your eye.  Pus draining from the stye. DIAGNOSIS  Your health care provider may be able to diagnose a stye just by examining your eye. The health care provider may also check to make sure:  You do not have a fever or other signs of a more serious infection.  The infection has not spread to other parts of your eye or areas around your eye. TREATMENT  Most styes will clear up in a few days without treatment. In some cases, you may need to use antibiotic drops or ointment to prevent infection. Your health care provider may have to drain the stye surgically if your stye is:  Large.  Causing a lot of pain.  Interfering  with your vision. This can be done using a thin blade or a needle.  HOME CARE INSTRUCTIONS   Take medicines only as directed by your health care provider.  Apply a clean, warm compress to your eye for 10 minutes, 4 times a day.  Do not wear contact lenses or eye makeup until your stye has healed.  Do not try to pop or drain the stye. SEEK MEDICAL CARE IF:  You have chills or a fever.  Your stye does not go away after several days.  Your stye affects your vision.  Your eyeball becomes swollen, red, or painful. MAKE SURE YOU:  Understand these instructions.  Will watch your condition.  Will get help right away if you are not doing well or get worse.   This information is not intended to replace advice given to you by your health care provider. Make sure you discuss any questions you have with your health care provider.   Document Released: 04/14/2005 Document Revised: 07/26/2014 Document Reviewed: 10/19/2013 Elsevier Interactive Patient Education [employer]

## 2015-09-18 NOTE — Progress Notes (Signed)
Date of Visit: 09/16/2015   HPI:  Patient presents for a same day appointment to discuss left eyelid swelling. Present starting 2 days ago. Eyelid was swollen worse then but has improved. Has been using warm compress. Has occasionally been able to squeeze small mount of green material out of eyelid. Otherwise no drainage. No fever, vision changes. Does not wear contacts. Pain when she pushes on eyelid, otherwise no pain. Eye not red.   ROS: See HPI  PMFSH: history scoliosis  PHYSICAL EXAM: BP 135/78 mmHg  Pulse 94  Temp(Src) 97.7 F (36.5 C) (Oral)  Ht  (1.575 m)  Wt 112 lb (50.803 kg)  BMI 20.48 kg/m2  LMP 09/09/2015  Visual acuity: L 20/20  R 20/20  B 20/20  Gen: NAD, pleasant, cooperative HEENT: normocephalic, atraumatic. pupils equal round and reactive to light. extraocular movements intact. Sclera without injection bilaterally. Mild erythema to L eyelid. Small hordeolum present along lid margin. No drainage. Moist mucous membranes   ASSESSMENT/PLAN:  1. Hordeolum - recommend supportive care with continued warm compresses. Reassuring that this has improved with conservative management. Follow up if not continuing to improve. Handout given. Patient appreciative and agreeable.   FOLLOW UP: Follow up as needed if symptoms worsen or fail to improve.    [place] J. Pollie Meyer, MD [facility]

## 2016-07-19 NOTE — L&D Delivery Note (Addendum)
Delivery Note  Patient presented w/ SROM. Protracted second stage, pushed for approximately 5 hours total. Maternal exhaustion, unable to push past plus 2 station. Risks/benefits of vacuum-assisted delivery discussed with patient including the rare but severe complication of ICH, shared decision to proceed w/ vacuum-assisted delivery. Gentle traction applied for three contractions, no pop-offs or loss of suction. Additional staff members including NICU were present for the delivery. Vacuum removed after delivery of fetal vertex; remainder of delivery proceeded without complication. Minimal respiratory effort after delivery, so after approximately 30 seconds decision made to clamp and cut cord and transfer to warmer for evaluation by NICU team.  At 9:19 AM a viable female was delivered via Vaginal, Spontaneous (Presentation: OA ).  APGAR: pending ; weight pending .   Placenta status: intact.  Cord: 3v. with the following complications: vacuum-assisted delivery.  Cord pH: pending  Anesthesia:  epidural Episiotomy: None Lacerations: 1st degree Suture Repair: 3.0 monocryl Est. Blood Loss (mL):  250  Mom to postpartum.  Baby to Couplet care / Skin to Skin.  Tammy Parks 06/29/2017, 9:34 AM

## 2016-07-27 ENCOUNTER — Ambulatory Visit (INDEPENDENT_AMBULATORY_CARE_PROVIDER_SITE_OTHER): Payer: 59 | Admitting: Internal Medicine

## 2016-07-27 ENCOUNTER — Encounter: Payer: Self-pay | Admitting: Internal Medicine

## 2016-07-27 DIAGNOSIS — B86 Scabies: Secondary | ICD-10-CM | POA: Insufficient documentation

## 2016-07-27 MED ORDER — HYDROCORTISONE 2.5 % EX OINT: TOPICAL_OINTMENT | Freq: Two times a day (BID) | CUTANEOUS | 0 refills | Status: DC

## 2016-07-27 MED ORDER — PERMETHRIN 5 % EX CREA: 1.0000 "application " | TOPICAL_CREAM | Freq: Once | CUTANEOUS | 0 refills | Status: AC

## 2016-07-27 NOTE — Assessment & Plan Note (Signed)
Pt with history and physical exam consistent with scabies. Her brother-in-law was diagnosed with scabies in October, around the time when they were at his house. - Permethrin cream prescribed for 2 treatments - Prescribed hydrocortisone cream to help with itching - Pt given handout on how to wash her clothes, bedding, etc - Follow-up if not improving

## 2016-07-27 NOTE — Patient Instructions (Addendum)
It was so nice to meet you!  I think you have scabies. Please apply the Permethrin cream to all of your skin tonight and leave it on for 8 hours before washing it off. You should repeat this in 3 days. It is very important that you wash all your bedding and any clothes you've worn recently. I have attached instructions below.   You will continue to itch for 1-2 weeks. I have prescribed Hydrocortisone cream to help with this.  If you are not better in 2 weeks, please come back to see [place]!  -Dr. Nancy Marus Scabies, Adult Introduction Scabies is a skin condition that happens when very small insects get under the skin (infestation). This causes a rash and severe itchiness. Scabies can spread from person to person (is contagious). If you get scabies, it is common for others in your household to get scabies too. With proper treatment, symptoms usually go away in 2-4 weeks. Scabies usually does not cause lasting problems. What are the causes? This condition is caused by mites (Sarcoptes scabiei, or human itch mites) that can only be seen with a microscope. The mites get into the top layer of skin and lay eggs. Scabies can spread from person to person through:  Close contact with a person who has scabies.  Contact with infested items, such as towels, bedding, or clothing. What increases the risk? This condition is more likely to develop in:  People who live in nursing homes and other extended-care facilities.  People who have sexual contact with a partner who has scabies.  Young children who attend child care facilities.  People who care for others who are at increased risk for scabies. What are the signs or symptoms? Symptoms of this condition may include:  Severe itchiness. This is often worse at night.  A rash that includes tiny red bumps or blisters. The rash commonly occurs on the wrist, elbow, armpit, fingers, waist, groin, or buttocks. Bumps may form a line (burrow) in some areas.  Skin  irritation. This can include scaly patches or sores. How is this diagnosed? This condition is diagnosed with a physical exam. Your health care provider will look closely at your skin. In some cases, your health care provider may take a sample of your affected skin (skin scraping) and have it examined under a microscope. How is this treated? This condition may be treated with:  Medicated cream or lotion that kills the mites. This is spread on the entire body and left on for several hours. Usually, one treatment with medicated cream or lotion is enough to kill all of the mites. In severe cases, the treatment may be repeated.  Medicated cream that relieves itching.  Medicines that help to relieve itching.  Medicines that kill the mites. This treatment is rarely used. Follow these instructions at home:   Medicines  Take or apply over-the-counter and prescription medicines as told by your health care provider.  Apply medicated cream or lotion as told by your health care provider.  Do not wash off the medicated cream or lotion until the necessary amount of time has passed. Skin Care  Avoid scratching your affected skin.  Keep your fingernails closely trimmed to reduce injury from scratching.  Take cool baths or apply cool washcloths to help reduce itching. General instructions  Clean all items that you recently had contact with, including bedding, clothing, and furniture. Do this on the same day that your treatment starts.  Use hot water when you wash items.  Place unwashable items into closed, airtight plastic bags for at least 3 days. The mites cannot live for more than 3 days away from human skin.  Vacuum furniture and mattresses that you use.  Make sure that other people who may have been infested are examined by a health care provider. These include members of your household and anyone who may have had contact with infested items.  Keep all follow-up visits as told by your  health care provider. This is important. Contact a health care provider if:  You have itching that does not go away after 4 weeks of treatment.  You continue to develop new bumps or burrows.  You have redness, swelling, or pain in your rash area after treatment.  You have fluid, blood, or pus coming from your rash. This information is not intended to replace advice given to you by your health care provider. Make sure you discuss any questions you have with your health care provider. Document Released: 03/26/2015 Document Revised: 12/11/2015 Document Reviewed: 02/04/2015  2017 Elsevier

## 2016-07-27 NOTE — Progress Notes (Signed)
   [facility] Phone: [phone]  Subjective:  Tammy MastSamantha is a 21 year old female presenting to clinic with a rash for 1 month. The rash is very itchy. The rash is located on her arms and hands. She has tried Cetaphil lotion, but it hasn't helped. She was at her brother-in-law's house, who was recently diagnosed with scabies in November. No fevers, no chills, no nausea, no vomiting, no spreading redness of skin.  ROS: See HPI for pertinent positives and negatives  Past Medical History- scoliosis  Family history reviewed for today's visit. No changes.  Social history- patient is a current smoker  Objective: There were no vitals taken for this visit. Gen: NAD, alert, cooperative with exam Skin: Multiple erythematous papules with overlying excoriations present on the arms, hands, and the webs of the fingers.   Assessment/Plan: Scabies: Pt with history and physical exam consistent with scabies. Her brother-in-law was diagnosed with scabies in October, around the time when they were at his house. - Permethrin cream prescribed for 2 treatments - Prescribed hydrocortisone cream to help with itching - Pt given handout on how to wash her clothes, bedding, etc - Follow-up if not improving   Willadean CarolKaty Berdena Cisek, MD PGY-2

## 2016-10-21 ENCOUNTER — Ambulatory Visit (INDEPENDENT_AMBULATORY_CARE_PROVIDER_SITE_OTHER): Payer: 59 | Admitting: Internal Medicine

## 2016-10-21 VITALS — BP 116/70 | HR 98 | Temp 97.5°F | Wt 112.0 lb

## 2016-10-21 DIAGNOSIS — Z3401 Encounter for supervision of normal first pregnancy, first trimester: Secondary | ICD-10-CM | POA: Diagnosis not present

## 2016-10-21 DIAGNOSIS — N912 Amenorrhea, unspecified: Secondary | ICD-10-CM | POA: Diagnosis not present

## 2016-10-21 LAB — POCT URINALYSIS DIP (MANUAL ENTRY)
Bilirubin, UA: NEGATIVE
Glucose, UA: NEGATIVE
Ketones, POC UA: NEGATIVE
Leukocytes, UA: NEGATIVE
NITRITE UA: NEGATIVE
PH UA: 6.5 (ref 5.0–8.0)
PROTEIN UA: NEGATIVE
RBC UA: NEGATIVE
Spec Grav, UA: 1.02 (ref 1.030–1.035)
UROBILINOGEN UA: 0.2 (ref ?–2.0)

## 2016-10-21 LAB — POCT URINE PREGNANCY: PREG TEST UR: POSITIVE — AB

## 2016-10-21 NOTE — Assessment & Plan Note (Signed)
Due to pregnancy. Positive pregnancy test in office today after two positive pregnancy tests at home. Pregnancy unplanned but desired. Patient and parents (who accompanied her to visit) very pleased with the news. As patient lives in [place], she would like to have her prenatal care at [facility] in [place]. Has appt scheduled with them for one week for today for initial OB visit.  - Continue prenatal vitamins - Prenatal labs obtained today (called [facility] who said it was fine for her to have the labs drawn here in preparation for her visit there next week) - Provided handout regarding first trimester pregnancy information - F/u at [facility] in one week for initial OB visit

## 2016-10-21 NOTE — Progress Notes (Signed)
   Subjective:   Patient: Tammy Parks       Birthdate: Jan 19, 1996       MRN: [id number]      HPI  Tammy Parks is a 21 y.o. female presenting for pregnancy test.   Patient with concern for pregnancy due to amenorrhea. LMP 03/01-03/05. Patient not currently on any form of contraception. Had Nexplanon which was removed in 2016, then was on Depo for a year, but has not been on any contraception for a while. Reports bilateral abdominal cramps for the past few days which she describes as a twinge that comes and goes. Also reports that her hips have been aching for the past two weeks, however she has been walking more at work and does have prior diagnosis of scoliosis. Patient also reports that her breasts have been aching. She has never been pregnant before. She took two home pregnancy tests four days ago which were both positive. She has been taking prenatal vitamins already.   Smoking status reviewed. Patient is current some day smoker.   Review of Systems See HPI.     Objective:  Physical Exam  Constitutional: She is oriented to person, place, and time and well-developed, well-nourished, and in no distress.  HENT:  Head: Normocephalic and atraumatic.  Eyes: Conjunctivae and EOM are normal. Right eye exhibits no discharge. Left eye exhibits no discharge.  Cardiovascular: Normal rate, regular rhythm and normal heart sounds.   No murmur heard. Pulmonary/Chest: Breath sounds normal. No respiratory distress. She has no wheezes.  Abdominal: Soft. Bowel sounds are normal. She exhibits no distension. There is no tenderness.  Neurological: She is alert and oriented to person, place, and time.  Skin: Skin is warm and dry.  Psychiatric: Affect and judgment normal.      Assessment & Plan:  Amenorrhea Due to pregnancy. Positive pregnancy test in office today after two positive pregnancy tests at home. Pregnancy unplanned but desired. Patient and parents (who accompanied her to  visit) very pleased with the news. As patient lives in [place], she would like to have her prenatal care at [facility] in [place]. Has appt scheduled with them for one week for today for initial OB visit.  - Continue prenatal vitamins - Prenatal labs obtained today (called [facility] who said it was fine for her to have the labs drawn here in preparation for her visit there next week) - Provided handout regarding first trimester pregnancy information - F/u at [facility] in one week for initial OB visit   Tarri Abernethy, MD, MPH PGY-2 [facility] Pager [phone]

## 2016-10-21 NOTE — Patient Instructions (Addendum)
It was nice meeting you today [name]!  We will see you back at your earliest convenience for your initial OB visit.   In the meantime, please continue taking prenatal vitamins. Also do not drink alcohol, use any tobacco products, or any illegal drugs.   If you have any questions or concerns, please feel free to call the clinic.   Be well,  Dr. Natale Milch   First Trimester of Pregnancy The first trimester of pregnancy is from week 1 until the end of week 13 (months 1 through 3). During this time, your baby will begin to develop inside you. At 6-8 weeks, the eyes and face are formed, and the heartbeat can be seen on ultrasound. At the end of 12 weeks, all the baby's organs are formed. Prenatal care is all the medical care you receive before the birth of your baby. Make sure you get good prenatal care and follow all of your doctor's instructions. Follow these instructions at home: Medicines   Take over-the-counter and prescription medicines only as told by your doctor. Some medicines are safe and some medicines are not safe during pregnancy.  Take a prenatal vitamin that contains at least 600 micrograms (mcg) of folic acid.  If you have trouble pooping (constipation), take medicine that will make your stool soft (stool softener) if your doctor approves. Eating and drinking   Eat regular, healthy meals.  Your doctor will tell you the amount of weight gain that is right for you.  Avoid raw meat and uncooked cheese.  If you feel sick to your stomach (nauseous) or throw up (vomit):  Eat 4 or 5 small meals a day instead of 3 large meals.  Try eating a few soda crackers.  Drink liquids between meals instead of during meals.  To prevent constipation:  Eat foods that are high in fiber, like fresh fruits and vegetables, whole grains, and beans.  Drink enough fluids to keep your pee (urine) clear or pale yellow. Activity   Exercise only as told by your doctor. Stop exercising if you  have cramps or pain in your lower belly (abdomen) or low back.  Do not exercise if it is too hot, too humid, or if you are in a place of great height (high altitude).  Try to avoid standing for long periods of time. Move your legs often if you must stand in one place for a long time.  Avoid heavy lifting.  Wear low-heeled shoes. Sit and stand up straight.  You can have sex unless your doctor tells you not to. Relieving pain and discomfort   Wear a good support bra if your breasts are sore.  Take warm water baths (sitz baths) to soothe pain or discomfort caused by hemorrhoids. Use hemorrhoid cream if your doctor says it is okay.  Rest with your legs raised if you have leg cramps or low back pain.  If you have puffy, bulging veins (varicose veins) in your legs:  Wear support hose or compression stockings as told by your doctor.  Raise (elevate) your feet for 15 minutes, 3-4 times a day.  Limit salt in your food. Prenatal care   Schedule your prenatal visits by the twelfth week of pregnancy.  Write down your questions. Take them to your prenatal visits.  Keep all your prenatal visits as told by your doctor. This is important. Safety   Wear your seat belt at all times when driving.  Make a list of emergency phone numbers. The list should include  numbers for family, friends, the hospital, and police and fire departments. General instructions   Ask your doctor for a referral to a local prenatal class. Begin classes no later than at the start of month 6 of your pregnancy.  Ask for help if you need counseling or if you need help with nutrition. Your doctor can give you advice or tell you where to go for help.  Do not use hot tubs, steam rooms, or saunas.  Do not douche or use tampons or scented sanitary pads.  Do not cross your legs for long periods of time.  Avoid all herbs and alcohol. Avoid drugs that are not approved by your doctor.  Do not use any tobacco products,  including cigarettes, chewing tobacco, and electronic cigarettes. If you need help quitting, ask your doctor. You may get counseling or other support to help you quit.  Avoid cat litter boxes and soil used by cats. These carry germs that can cause birth defects in the baby and can cause a loss of your baby (miscarriage) or stillbirth.  Visit your dentist. At home, brush your teeth with a soft toothbrush. Be gentle when you floss. Contact a doctor if:  You are dizzy.  You have mild cramps or pressure in your lower belly.  You have a nagging pain in your belly area.  You continue to feel sick to your stomach, you throw up, or you have watery poop (diarrhea).  You have a bad smelling fluid coming from your vagina.  You have pain when you pee (urinate).  You have increased puffiness (swelling) in your face, hands, legs, or ankles. Get help right away if:  You have a fever.  You are leaking fluid from your vagina.  You have spotting or bleeding from your vagina.  You have very bad belly cramping or pain.  You gain or lose weight rapidly.  You throw up blood. It may look like coffee grounds.  You are around people who have [place] measles, fifth disease, or chickenpox.  You have a very bad headache.  You have shortness of breath.  You have any kind of trauma, such as from a fall or a car accident. Summary  The first trimester of pregnancy is from week 1 until the end of week 13 (months 1 through 3).  To take care of yourself and your unborn baby, you will need to eat healthy meals, take medicines only if your doctor tells you to do so, and do activities that are safe for you and your baby.  Keep all follow-up visits as told by your doctor. This is important as your doctor will have to ensure that your baby is healthy and growing well. This information is not intended to replace advice given to you by your health care provider. Make sure you discuss any questions you have with  your health care provider. Document Released: 12/22/2007 Document Revised: 07/13/2016 Document Reviewed: 07/13/2016 Elsevier Interactive Patient Education  2017 [employer].

## 2016-10-23 LAB — URINE CULTURE: ORGANISM ID, BACTERIA: NO GROWTH

## 2016-10-27 ENCOUNTER — Encounter: Payer: Self-pay | Admitting: Adult Health

## 2016-10-28 ENCOUNTER — Encounter: Payer: Self-pay | Admitting: Women's Health

## 2016-10-28 ENCOUNTER — Ambulatory Visit (INDEPENDENT_AMBULATORY_CARE_PROVIDER_SITE_OTHER): Payer: 59 | Admitting: Women's Health

## 2016-10-28 VITALS — BP 100/60 | HR 80 | Ht 62.0 in | Wt 114.0 lb

## 2016-10-28 DIAGNOSIS — Z3201 Encounter for pregnancy test, result positive: Secondary | ICD-10-CM | POA: Diagnosis not present

## 2016-10-28 DIAGNOSIS — N926 Irregular menstruation, unspecified: Secondary | ICD-10-CM | POA: Diagnosis not present

## 2016-10-28 DIAGNOSIS — Z3490 Encounter for supervision of normal pregnancy, unspecified, unspecified trimester: Secondary | ICD-10-CM

## 2016-10-28 LAB — POCT URINE PREGNANCY: Preg Test, Ur: POSITIVE — AB

## 2016-10-28 NOTE — Progress Notes (Signed)
   [facility] Visit  Patient name: Tammy Parks MRN [id number]  Date of birth: [birth year]  CC & HPI:  Tammy Parks is a 21 y.o. G1P0 Caucasian female presenting today for +UPT at home. Some nausea, no vomiting. Some cramping, no vb. Taking pnv. No other meds. No h/o chronic medical conditions. Some dizziness. Regular periods w/ certain LMP of 09/16/16.  Constipation.  Patient's last menstrual period was 09/16/2016.  Pertinent History Reviewed:  Medical & Surgical Hx:   Past medical, surgical, family, and social history reviewed in electronic medical record Medications: Reviewed & Updated - see associated section Allergies: Reviewed in electronic medical record  Objective Findings:  Vitals: BP 100/60   Pulse 80   Ht  (1.575 m)   Wt 114 lb (51.7 kg)   LMP 09/16/2016   BMI 20.85 kg/m  Body mass index is 20.85 kg/m.  Physical Examination: General appearance - alert, well appearing, and in no distress  Results for orders placed or performed in visit on 10/28/16 (from the past 24 hour(s))  POCT urine pregnancy   Collection Time: 10/28/16  3:55 PM  Result Value Ref Range   Preg Test, Ur Positive (A) Negative     Assessment & Plan:  A:   [redacted]w[redacted]d by certain LMP  Mild nausea  Some dizziness  Constipation  P:  Gave printed info on n/v, constipation, 1st trimester  Discussed dizzy spells, stay well hydrated and eat frequent protein  Return in about 1 week (around 11/04/2016) for dating u/s, then 3wks from now for , intake w/ tish & new ob.  Marge Duncans CNM, [facility] 10/28/2016 4:20 PM

## 2016-10-28 NOTE — Patient Instructions (Signed)

## 2016-11-04 ENCOUNTER — Ambulatory Visit (INDEPENDENT_AMBULATORY_CARE_PROVIDER_SITE_OTHER): Payer: 59

## 2016-11-04 DIAGNOSIS — Z3490 Encounter for supervision of normal pregnancy, unspecified, unspecified trimester: Secondary | ICD-10-CM

## 2016-11-04 DIAGNOSIS — Z3481 Encounter for supervision of other normal pregnancy, first trimester: Secondary | ICD-10-CM

## 2016-11-04 NOTE — Progress Notes (Signed)
[place] 7 wks,single IUP w/ys,pos fht 124 bpm,normal ovaries bilat,crl 10.3 mm,EDD 06/23/2017

## 2016-11-15 ENCOUNTER — Telehealth: Payer: Self-pay | Admitting: *Deleted

## 2016-11-15 NOTE — Telephone Encounter (Signed)
Patient called with complaints of nausea and vomiting that just started today. She cannot keep water or crackers down.  Informed patient that nausea and vomiting are both very common in the early part of pregnancy. Advised to drink sips of fluids and not large amounts of water, gatorade, ginger ale etc and to eat small frequent snacks. Pt verbalized understanding and will try recommendations.

## 2016-11-17 ENCOUNTER — Ambulatory Visit: Payer: 59 | Admitting: *Deleted

## 2016-11-17 ENCOUNTER — Encounter: Payer: Self-pay | Admitting: Women's Health

## 2016-11-17 ENCOUNTER — Ambulatory Visit (INDEPENDENT_AMBULATORY_CARE_PROVIDER_SITE_OTHER): Payer: 59 | Admitting: Women's Health

## 2016-11-17 ENCOUNTER — Other Ambulatory Visit (HOSPITAL_COMMUNITY)
Admission: RE | Admit: 2016-11-17 | Discharge: 2016-11-17 | Disposition: A | Discharge status: Home / Self Care | Payer: 59 | Source: Ambulatory Visit | Attending: Obstetrics & Gynecology | Admitting: Obstetrics & Gynecology

## 2016-11-17 VITALS — BP 110/70 | HR 70 | Wt 119.0 lb

## 2016-11-17 DIAGNOSIS — Z124 Encounter for screening for malignant neoplasm of cervix: Secondary | ICD-10-CM | POA: Diagnosis not present

## 2016-11-17 DIAGNOSIS — Z3401 Encounter for supervision of normal first pregnancy, first trimester: Secondary | ICD-10-CM

## 2016-11-17 DIAGNOSIS — Z3A08 8 weeks gestation of pregnancy: Secondary | ICD-10-CM

## 2016-11-17 DIAGNOSIS — Z1389 Encounter for screening for other disorder: Secondary | ICD-10-CM

## 2016-11-17 DIAGNOSIS — Z3400 Encounter for supervision of normal first pregnancy, unspecified trimester: Secondary | ICD-10-CM | POA: Insufficient documentation

## 2016-11-17 DIAGNOSIS — Z3682 Encounter for antenatal screening for nuchal translucency: Secondary | ICD-10-CM

## 2016-11-17 DIAGNOSIS — Z331 Pregnant state, incidental: Secondary | ICD-10-CM

## 2016-11-17 LAB — POCT URINALYSIS DIPSTICK
Glucose, UA: NEGATIVE
Ketones, UA: NEGATIVE
Leukocytes, UA: NEGATIVE
NITRITE UA: NEGATIVE
Protein, UA: NEGATIVE
RBC UA: NEGATIVE

## 2016-11-17 NOTE — Patient Instructions (Signed)

## 2016-11-17 NOTE — Progress Notes (Signed)
  Subjective:  Tammy Parks is a 21 y.o. G1P0 Caucasian female at [redacted]w[redacted]d by LMP c/w 7wk u/s, being seen today for her first obstetrical visit.  Her obstetrical history is significant for primigravida, stopped smoking at last visit.  Pregnancy history fully reviewed.  Patient reports n/v yesterday, better today- declines meds. Denies vb, cramping, uti s/s, abnormal/malodorous vag d/c, or vulvovaginal itching/irritation.  BP 110/70   Pulse 70   Wt 119 lb (54 kg)   LMP 09/16/2016 (Exact Date)   BMI 21.77 kg/m   HISTORY: OB History  Gravida Para Term Preterm AB Living  1            SAB TAB Ectopic Multiple Live Births               # Outcome Date GA Lbr Len/2nd Weight Sex Delivery Anes PTL Lv  1 Current              Past Medical History:  Diagnosis Date  . Scoliosis    Past Surgical History:  Procedure Laterality Date  . WISDOM TOOTH EXTRACTION     Family History  Problem Relation Age of Onset  . Cancer Paternal Grandmother   . Thyroid disease Paternal Grandmother   . Breast cancer Maternal Grandmother   . Cancer Maternal Uncle   . ADD / ADHD Brother   . Diabetes Maternal Grandfather   . Thyroid disease Paternal Aunt     Exam   System:     General: Well developed & nourished, no acute distress   Skin: Warm & dry, normal coloration and turgor, no rashes   Neurologic: Alert & oriented, normal mood   Cardiovascular: Regular rate & rhythm   Respiratory: Effort & rate normal, LCTAB, acyanotic   Abdomen: Soft, non tender   Extremities: normal strength, tone   Pelvic Exam:    Perineum: Normal perineum   Vulva: Normal, no lesions   Vagina:  Normal mucosa, normal discharge   Cervix: Normal, bulbous, appears closed   Uterus: Normal size/shape/contour for GA   Thin prep pap smear obtained w/ reflex high risk HPV cotesting FHR: + via informal u/s   Assessment:   Pregnancy: G1P0 Patient Active Problem List   Diagnosis Date Noted  . Supervision of normal first  pregnancy 11/17/2016  . Pregnant 10/28/2016  . Scabies 07/27/2016  . Abnormal uterine bleeding 01/15/2015  . Near syncope 08/31/2013  . Scoliosis 03/03/2011    [redacted]w[redacted]d G1P0 New OB visit Previous smoker  Plan:  Initial labs obtained Continue prenatal vitamins Problem list reviewed and updated Reviewed n/v relief measures and warning s/s to report Reviewed recommended weight gain based on pre-gravid BMI Encouraged well-balanced diet Genetic Screening discussed Integrated Screen: requested Cystic fibrosis screening discussed declined Ultrasound discussed; fetal survey: requested Follow up in 4 weeks for 1st it/nt and visit [facility] completed NFPartnership offered, accepted, referral faxed   Marge Duncans CNM, [facility] 11/17/2016 10:04 AM

## 2016-11-18 ENCOUNTER — Telehealth: Payer: Self-pay | Admitting: Obstetrics & Gynecology

## 2016-11-18 LAB — URINALYSIS, ROUTINE W REFLEX MICROSCOPIC
BILIRUBIN UA: NEGATIVE
GLUCOSE, UA: NEGATIVE
KETONES UA: NEGATIVE
LEUKOCYTES UA: NEGATIVE
NITRITE UA: NEGATIVE
Protein, UA: NEGATIVE
RBC UA: NEGATIVE
SPEC GRAV UA: 1.026 (ref 1.005–1.030)
Urobilinogen, Ur: 1 mg/dL (ref 0.2–1.0)
pH, UA: 5.5 (ref 5.0–7.5)

## 2016-11-18 LAB — HIV ANTIBODY (ROUTINE TESTING W REFLEX): HIV SCREEN 4TH GENERATION: NONREACTIVE

## 2016-11-18 LAB — RPR: RPR Ser Ql: NONREACTIVE

## 2016-11-18 LAB — PMP SCREEN PROFILE (10S), URINE
AMPHETAMINE SCREEN URINE: NEGATIVE ng/mL
BARBITURATE SCREEN URINE: NEGATIVE ng/mL
BENZODIAZEPINE SCREEN, URINE: NEGATIVE ng/mL
CANNABINOIDS UR QL SCN: NEGATIVE ng/mL
CREATININE(CRT), U: 133.8 mg/dL (ref 20.0–300.0)
Cocaine (Metab) Scrn, Ur: NEGATIVE ng/mL
METHADONE SCREEN, URINE: NEGATIVE ng/mL
OXYCODONE+OXYMORPHONE UR QL SCN: NEGATIVE ng/mL
Opiate Scrn, Ur: NEGATIVE ng/mL
PH UR, DRUG SCRN: 5.5 (ref 4.5–8.9)
PHENCYCLIDINE QUANTITATIVE URINE: NEGATIVE ng/mL
PROPOXYPHENE SCREEN URINE: NEGATIVE ng/mL

## 2016-11-18 LAB — RUBELLA SCREEN: Rubella Antibodies, IGG: 2.31 index (ref >0.99)

## 2016-11-18 LAB — ABO/RH: Rh Factor: POSITIVE

## 2016-11-18 LAB — VARICELLA ZOSTER ANTIBODY, IGG: VARICELLA: 274 {index} (ref >165)

## 2016-11-18 LAB — ANTIBODY SCREEN: Antibody Screen: NEGATIVE

## 2016-11-18 LAB — HEPATITIS B SURFACE ANTIGEN: HEP B S AG: NEGATIVE

## 2016-11-18 MED ORDER — PROMETHAZINE HCL 25 MG PO TABS: 12.5000 mg | ORAL_TABLET | Freq: Four times a day (QID) | ORAL | 0 refills | Status: DC | PRN

## 2016-11-18 NOTE — Telephone Encounter (Signed)
Left message advising pt to check with pharmacy later today. JSY 

## 2016-11-19 LAB — CYTOLOGY - PAP
CHLAMYDIA, DNA PROBE: NEGATIVE
Diagnosis: NEGATIVE
NEISSERIA GONORRHEA: NEGATIVE

## 2016-11-19 LAB — URINE CULTURE

## 2016-12-15 ENCOUNTER — Ambulatory Visit (INDEPENDENT_AMBULATORY_CARE_PROVIDER_SITE_OTHER): Payer: 59

## 2016-12-15 ENCOUNTER — Encounter: Payer: Self-pay | Admitting: Advanced Practice Midwife

## 2016-12-15 ENCOUNTER — Ambulatory Visit (INDEPENDENT_AMBULATORY_CARE_PROVIDER_SITE_OTHER): Payer: 59 | Admitting: Advanced Practice Midwife

## 2016-12-15 VITALS — BP 100/70 | HR 72 | Wt 123.4 lb

## 2016-12-15 DIAGNOSIS — Z3682 Encounter for antenatal screening for nuchal translucency: Secondary | ICD-10-CM

## 2016-12-15 DIAGNOSIS — Z3401 Encounter for supervision of normal first pregnancy, first trimester: Secondary | ICD-10-CM

## 2016-12-15 DIAGNOSIS — Z331 Pregnant state, incidental: Secondary | ICD-10-CM

## 2016-12-15 DIAGNOSIS — Z1389 Encounter for screening for other disorder: Secondary | ICD-10-CM

## 2016-12-15 LAB — POCT URINALYSIS DIPSTICK
Blood, UA: NEGATIVE
GLUCOSE UA: NEGATIVE
KETONES UA: NEGATIVE
Leukocytes, UA: NEGATIVE
Nitrite, UA: NEGATIVE
Protein, UA: NEGATIVE

## 2016-12-15 NOTE — Progress Notes (Signed)
US 12+6 wks,measurements c/w dates,NB present,NT 2.2 mm,normal ovaries bilat, post pl gr 0,crl 70.07 mm,fhr 171 bpm,

## 2016-12-15 NOTE — Progress Notes (Signed)
G1P0 3876w6d Estimated Date of Delivery: 06/23/17  Blood pressure 100/70, pulse 72, weight 123 lb 6.4 oz (56 kg), last menstrual period 09/16/2016.   BP weight and urine results all reviewed and noted.  Please refer to the obstetrical flow sheet for the fundal height and fetal heart rate documentation:   US 12+6 wks,measurements c/w dates,NB present,NT 2.2 mm,normal ovaries bilat, post pl gr 0,crl 70.07 mm,fhr 171 bpm,  Patient denies any bleeding and no rupture of membranes symptoms or regular contractions. Patient is without complaints. All questions were answered.  Orders Placed This Encounter  Procedures  . Maternal Screen, Integrated #1  . POCT urinalysis dipstick    Plan:  Continued routine obstetrical care,   Return in about 4 weeks (around 01/12/2017) for 2nd IT, LROB.

## 2016-12-18 LAB — MATERNAL SCREEN, INTEGRATED #1
CROWN RUMP LENGTH MAT SCREEN: 70.1 mm
Gest. Age on Collection Date: 13 weeks
Maternal Age at EDD: 23 yr
Nuchal Translucency (NT): 2.2 mm
Number of Fetuses: 1
PAPP-A Value: 2135.7 ng/mL
WEIGHT: 123 [lb_av]

## 2017-01-12 ENCOUNTER — Ambulatory Visit (INDEPENDENT_AMBULATORY_CARE_PROVIDER_SITE_OTHER): Payer: 59 | Admitting: Advanced Practice Midwife

## 2017-01-12 ENCOUNTER — Encounter: Payer: Self-pay | Admitting: Advanced Practice Midwife

## 2017-01-12 VITALS — BP 90/50 | HR 78 | Wt 128.3 lb

## 2017-01-12 DIAGNOSIS — Z331 Pregnant state, incidental: Secondary | ICD-10-CM

## 2017-01-12 DIAGNOSIS — Z1389 Encounter for screening for other disorder: Secondary | ICD-10-CM

## 2017-01-12 DIAGNOSIS — Z3402 Encounter for supervision of normal first pregnancy, second trimester: Secondary | ICD-10-CM

## 2017-01-12 DIAGNOSIS — Z363 Encounter for antenatal screening for malformations: Secondary | ICD-10-CM

## 2017-01-12 DIAGNOSIS — Z3682 Encounter for antenatal screening for nuchal translucency: Secondary | ICD-10-CM

## 2017-01-12 LAB — POCT URINALYSIS DIPSTICK
GLUCOSE UA: NEGATIVE
KETONES UA: NEGATIVE
LEUKOCYTES UA: NEGATIVE
Nitrite, UA: NEGATIVE
Protein, UA: NEGATIVE
RBC UA: NEGATIVE

## 2017-01-12 NOTE — Progress Notes (Signed)
error 

## 2017-01-12 NOTE — Patient Instructions (Signed)

## 2017-01-12 NOTE — Progress Notes (Signed)
G1P0 9033w6d Estimated Date of Delivery: 06/23/17  Blood pressure (!) 90/50, pulse 78, weight 128 lb 4.8 oz (58.2 kg), last menstrual period 09/16/2016.   BP weight and urine results all reviewed and noted.  Please refer to the obstetrical flow sheet for the fundal height and fetal heart rate documentation:  Patient denies any bleeding and no rupture of membranes symptoms or regular contractions. Patient has a mole on her vulva that used to be flat, but now is raised/discolored.  Advised to see a dermatologist and have it removed.  If she has any problems w/Preg. Medicaid not covering a dermatologist visit, let us know and one of the MD's can probably remove it.  All questions were answered.  Orders Placed This Encounter  Procedures  . US OB Comp + 14 Wk  . Maternal Screen, Integrated #2  . POCT urinalysis dipstick    Plan:  Continued routine obstetrical care, 2nd IT  Return in about 2 weeks (around 01/26/2017) for LROB, WU:[id number]:Anatomy.

## 2017-01-15 LAB — MATERNAL SCREEN, INTEGRATED #2
AFP MARKER: 23.5 ng/mL
AFP MoM: 0.59
CROWN RUMP LENGTH: 70.1 mm
DIA MOM: 2.19
DIA VALUE: 410.7 pg/mL
Estriol, Unconjugated: 1.69 ng/mL
GESTATIONAL AGE: 17 wk
Gest. Age on Collection Date: 13 weeks
HCG MOM: 2.18
MATERNAL AGE AT EDD: 23 a
NUCHAL TRANSLUCENCY (NT): 2.2 mm
NUCHAL TRANSLUCENCY MOM: 1.27
Number of Fetuses: 1
PAPP-A MOM: 1.51
PAPP-A VALUE: 2135.7 ng/mL
Test Results:: NEGATIVE
Weight: 123 [lb_av]
Weight: 123 [lb_av]
hCG Value: 67.6 IU/mL
uE3 MoM: 1.7

## 2017-01-26 ENCOUNTER — Ambulatory Visit (INDEPENDENT_AMBULATORY_CARE_PROVIDER_SITE_OTHER): Payer: 59 | Admitting: Obstetrics and Gynecology

## 2017-01-26 ENCOUNTER — Encounter: Payer: Self-pay | Admitting: Obstetrics and Gynecology

## 2017-01-26 ENCOUNTER — Ambulatory Visit (INDEPENDENT_AMBULATORY_CARE_PROVIDER_SITE_OTHER): Payer: 59

## 2017-01-26 VITALS — BP 90/60 | HR 88 | Wt 136.2 lb

## 2017-01-26 DIAGNOSIS — Z363 Encounter for antenatal screening for malformations: Secondary | ICD-10-CM

## 2017-01-26 DIAGNOSIS — Z1389 Encounter for screening for other disorder: Secondary | ICD-10-CM

## 2017-01-26 DIAGNOSIS — Z331 Pregnant state, incidental: Secondary | ICD-10-CM

## 2017-01-26 DIAGNOSIS — Z3401 Encounter for supervision of normal first pregnancy, first trimester: Secondary | ICD-10-CM

## 2017-01-26 DIAGNOSIS — Z3402 Encounter for supervision of normal first pregnancy, second trimester: Secondary | ICD-10-CM

## 2017-01-26 DIAGNOSIS — Z3A18 18 weeks gestation of pregnancy: Secondary | ICD-10-CM

## 2017-01-26 LAB — POCT URINALYSIS DIPSTICK
Blood, UA: NEGATIVE
Glucose, UA: NEGATIVE
Ketones, UA: NEGATIVE
LEUKOCYTES UA: NEGATIVE
NITRITE UA: NEGATIVE
PROTEIN UA: NEGATIVE

## 2017-01-26 NOTE — Progress Notes (Signed)
US 18+6 wks,breech,post pl gr 0,cx 3 cm,normal ovaries bilat,svp of fluid 5 cm,fhr 145 bpm,EFW 265 g,anatomy complete,no obvious abnormalities seen

## 2017-01-26 NOTE — Progress Notes (Addendum)
Tammy Parks is a 21 y.o. female G1P0  Estimated Date of Delivery: 06/23/17 LROB 4243w6d  Chief Complaint  Patient presents with  . Routine Prenatal Visit    ultrasound  ____  Patient reports numbness in her hands that radiates up to her shoulders. She states it used to stop when she woke up, but now it continues to occur throughout the day. She notes she has a hx of scoliosis. She has no other complaints. Patient reports good fetal movement,                          denies any bleeding , rupture of membranes,or regular contractions.  Last menstrual period 09/16/2016.   Urine results: Negative refer to the ob flow sheet for FH and FHR, ,                          Physical Examination: General appearance - alert, well appearing, and in no distress                                      Abdomen - FH not indicated ,                                                         -FHR 145 bpm                                                         soft, nontender, nondistended, no masses or organomegaly                                      Pelvic - not indicated                                            Questions were answered. Assessment: LROB G1P0 @ 5743w6d Estimated Date of Delivery: 06/23/17   Plan:  Continued routine obstetrical care, LROB  F/u in 4 weeks for LROB  By signing my name below, I, Izna Ahmed, attest that this documentation has been prepared under the direction and in the presence of Tilda BurrowFerguson, Annalee Meyerhoff V, MD. Electronically Signed: [facility] Gainer[facility]zna Ahmed, ED Scribe. 01/26/17. 11:52 AM.  I personally performed the services described in this documentation, which was SCRIBED in my presence. The recorded information has been reviewed and considered accurate. It has been edited as necessary during review. Tilda BurrowFERGUSON,Zaiya Annunziato V, MD

## 2017-02-23 ENCOUNTER — Ambulatory Visit (INDEPENDENT_AMBULATORY_CARE_PROVIDER_SITE_OTHER): Payer: 59 | Admitting: Women's Health

## 2017-02-23 ENCOUNTER — Encounter: Payer: Self-pay | Admitting: Women's Health

## 2017-02-23 VITALS — BP 102/64 | HR 84 | Wt 137.0 lb

## 2017-02-23 DIAGNOSIS — Z3A22 22 weeks gestation of pregnancy: Secondary | ICD-10-CM

## 2017-02-23 DIAGNOSIS — Z1389 Encounter for screening for other disorder: Secondary | ICD-10-CM

## 2017-02-23 DIAGNOSIS — Z3402 Encounter for supervision of normal first pregnancy, second trimester: Secondary | ICD-10-CM

## 2017-02-23 DIAGNOSIS — Z331 Pregnant state, incidental: Secondary | ICD-10-CM

## 2017-02-23 LAB — POCT URINALYSIS DIPSTICK
Glucose, UA: NEGATIVE
Ketones, UA: NEGATIVE
NITRITE UA: NEGATIVE
Protein, UA: NEGATIVE
RBC UA: NEGATIVE

## 2017-02-23 NOTE — Progress Notes (Signed)
Low-risk OB appointment G1P0 8733w6d Estimated Date of Delivery: 06/23/17 BP 102/64   Pulse 84   Wt 137 lb (62.1 kg)   LMP 09/16/2016 (Exact Date)   BMI 25.06 kg/m   BP, weight, and urine reviewed.  Refer to obstetrical flow sheet for FH & FHR.  Reports good fm.  Denies regular uc's, lof, vb, or uti s/s. No complaints. Reviewed ptl s/s, fm. Plan:  Continue routine obstetrical care  F/U in 4wks for OB appointment and pn2

## 2017-02-23 NOTE — Patient Instructions (Addendum)
You will have your sugar test next visit.  Please do not eat or drink anything after midnight the night before you come, not even water.  You will be here for at least two hours.     Call the office ([phone]) or go to [facility] if:  You begin to have strong, frequent contractions  Your water breaks.  Sometimes it is a big gush of fluid, sometimes it is just a trickle that keeps getting your panties wet or running down your legs  You have vaginal bleeding.  It is normal to have a small amount of spotting if your cervix was checked.   You don't feel your baby moving like normal.  If you don't, get you something to eat and drink and lay down and focus on feeling your baby move.   If your baby is still not moving like normal, you should call the office or go to [facility].  [place] Pediatricians/Family Doctors:  [facility] [phone]            [facility] [phone]                 [facility] [phone] (usually not accepting new patients unless you have family there already, you are always welcome to call and ask)            [place] Pediatricians/Family Doctors:   [facility]: [phone]  [facility]: [phone]    Second Trimester of Pregnancy The second trimester is from week 13 through week 28, months 4 through 6. The second trimester is often a time when you feel your best. Your body has also adjusted to being pregnant, and you begin to feel better physically. Usually, morning sickness has lessened or quit completely, you may have more energy, and you may have an increase in appetite. The second trimester is also a time when the fetus is growing rapidly. At the end of the sixth month, the fetus is about 9 inches long and weighs about 1 pounds. You will likely begin to feel the baby move (quickening) between 18 and 20 weeks of the pregnancy. BODY CHANGES Your body goes through many changes  during pregnancy. The changes vary from woman to woman.   Your weight will continue to increase. You will notice your lower abdomen bulging out.  You may begin to get stretch marks on your hips, abdomen, and breasts.  You may develop headaches that can be relieved by medicines approved by your health care provider.  You may urinate more often because the fetus is pressing on your bladder.  You may develop or continue to have heartburn as a result of your pregnancy.  You may develop constipation because certain hormones are causing the muscles that push waste through your intestines to slow down.  You may develop hemorrhoids or swollen, bulging veins (varicose veins).  You may have back pain because of the weight gain and pregnancy hormones relaxing your joints between the bones in your pelvis and as a result of a shift in weight and the muscles that support your balance.  Your breasts will continue to grow and be tender.  Your gums may bleed and may be sensitive to brushing and flossing.  Dark spots or blotches (chloasma, mask of pregnancy) may develop on your face. This will likely fade after the baby is born.  A dark line from your belly button to the pubic area (linea nigra) may appear. This will likely fade after the baby   is born.  You may have changes in your hair. These can include thickening of your hair, rapid growth, and changes in texture. Some women also have hair loss during or after pregnancy, or hair that feels dry or thin. Your hair will most likely return to normal after your baby is born. WHAT TO EXPECT AT YOUR PRENATAL VISITS During a routine prenatal visit:  You will be weighed to make sure you and the fetus are growing normally.  Your blood pressure will be taken.  Your abdomen will be measured to track your baby's growth.  The fetal heartbeat will be listened to.  Any test results from the previous visit will be discussed. Your health care provider may ask  you:  How you are feeling.  If you are feeling the baby move.  If you have had any abnormal symptoms, such as leaking fluid, bleeding, severe headaches, or abdominal cramping.  If you have any questions. Other tests that may be performed during your second trimester include:  Blood tests that check for:  Low iron levels (anemia).  Gestational diabetes (between 24 and 28 weeks).  Rh antibodies.  Urine tests to check for infections, diabetes, or protein in the urine.  An ultrasound to confirm the proper growth and development of the baby.  An amniocentesis to check for possible genetic problems.  Fetal screens for spina bifida and Down syndrome. HOME CARE INSTRUCTIONS   Avoid all smoking, herbs, alcohol, and unprescribed drugs. These chemicals affect the formation and growth of the baby.  Follow your health care provider's instructions regarding medicine use. There are medicines that are either safe or unsafe to take during pregnancy.  Exercise only as directed by your health care provider. Experiencing uterine cramps is a good sign to stop exercising.  Continue to eat regular, healthy meals.  Wear a good support bra for breast tenderness.  Do not use hot tubs, steam rooms, or saunas.  Wear your seat belt at all times when driving.  Avoid raw meat, uncooked cheese, cat litter boxes, and soil used by cats. These carry germs that can cause birth defects in the baby.  Take your prenatal vitamins.  Try taking a stool softener (if your health care provider approves) if you develop constipation. Eat more high-fiber foods, such as fresh vegetables or fruit and whole grains. Drink plenty of fluids to keep your urine clear or pale yellow.  Take warm sitz baths to soothe any pain or discomfort caused by hemorrhoids. Use hemorrhoid cream if your health care provider approves.  If you develop varicose veins, wear support hose. Elevate your feet for 15 minutes, 3-4 times a day.  Limit salt in your diet.  Avoid heavy lifting, wear low heel shoes, and practice good posture.  Rest with your legs elevated if you have leg cramps or low back pain.  Visit your dentist if you have not gone yet during your pregnancy. Use a soft toothbrush to brush your teeth and be gentle when you floss.  A sexual relationship may be continued unless your health care provider directs you otherwise.  Continue to go to all your prenatal visits as directed by your health care provider. SEEK MEDICAL CARE IF:   You have dizziness.  You have mild pelvic cramps, pelvic pressure, or nagging pain in the abdominal area.  You have persistent nausea, vomiting, or diarrhea.  You have a bad smelling vaginal discharge.  You have pain with urination. SEEK IMMEDIATE MEDICAL CARE IF:   You have   a fever.  You are leaking fluid from your vagina.  You have spotting or bleeding from your vagina.  You have severe abdominal cramping or pain.  You have rapid weight gain or loss.  You have shortness of breath with chest pain.  You notice sudden or extreme swelling of your face, hands, ankles, feet, or legs.  You have not felt your baby move in over an hour.  You have severe headaches that do not go away with medicine.  You have vision changes. Document Released: 06/29/2001 Document Revised: 07/10/2013 Document Reviewed: 09/05/2012 ExitCare Patient Information 2015 [employer]. This information is not intended to replace advice given to you by your health care provider. Make sure you discuss any questions you have with your health care provider.     

## 2017-03-23 ENCOUNTER — Other Ambulatory Visit: Payer: 59

## 2017-03-23 ENCOUNTER — Ambulatory Visit (INDEPENDENT_AMBULATORY_CARE_PROVIDER_SITE_OTHER): Payer: 59 | Admitting: Women's Health

## 2017-03-23 ENCOUNTER — Encounter: Payer: Self-pay | Admitting: Women's Health

## 2017-03-23 VITALS — BP 108/56 | HR 86 | Wt 142.0 lb

## 2017-03-23 DIAGNOSIS — Z3A26 26 weeks gestation of pregnancy: Secondary | ICD-10-CM

## 2017-03-23 DIAGNOSIS — Z3402 Encounter for supervision of normal first pregnancy, second trimester: Secondary | ICD-10-CM

## 2017-03-23 DIAGNOSIS — Z331 Pregnant state, incidental: Secondary | ICD-10-CM

## 2017-03-23 DIAGNOSIS — Z1389 Encounter for screening for other disorder: Secondary | ICD-10-CM

## 2017-03-23 DIAGNOSIS — Z131 Encounter for screening for diabetes mellitus: Secondary | ICD-10-CM

## 2017-03-23 DIAGNOSIS — Z029 Encounter for administrative examinations, unspecified: Secondary | ICD-10-CM

## 2017-03-23 LAB — POCT URINALYSIS DIPSTICK
Blood, UA: NEGATIVE
GLUCOSE UA: NEGATIVE
KETONES UA: NEGATIVE
LEUKOCYTES UA: NEGATIVE
Nitrite, UA: NEGATIVE
Protein, UA: NEGATIVE

## 2017-03-23 NOTE — Progress Notes (Signed)
Low-risk OB appointment G1P0 5240w6d Estimated Date of Delivery: 06/23/17 BP (!) 108/56   Pulse 86   Wt 142 lb (64.4 kg)   LMP 09/16/2016 (Exact Date)   BMI 25.97 kg/m   BP, weight, and urine reviewed.  Refer to obstetrical flow sheet for FH & FHR.  Reports good fm.  Denies regular uc's, lof, vb, or uti s/s. No complaints. Reviewed ptl s/s, fkc. Recommended Tdap at HD/PCP per CDC guidelines.  Plan:  Continue routine obstetrical care  F/U in 4wks for OB appointment  PN2 today

## 2017-03-23 NOTE — Patient Instructions (Signed)

## 2017-03-24 LAB — GLUCOSE TOLERANCE, 2 HOURS W/ 1HR
GLUCOSE, 1 HOUR: 116 mg/dL (ref 65–179)
GLUCOSE, FASTING: 74 mg/dL (ref 65–91)
Glucose, 2 hour: 97 mg/dL (ref 65–152)

## 2017-03-24 LAB — CBC
Hematocrit: 35.6 % (ref 34.0–46.6)
Hemoglobin: 12.1 g/dL (ref 11.1–15.9)
MCH: 31.7 pg (ref 26.6–33.0)
MCHC: 34 g/dL (ref 31.5–35.7)
MCV: 93 fL (ref 79–97)
PLATELETS: 286 10*3/uL (ref 150–379)
RBC: 3.82 x10E6/uL (ref 3.77–5.28)
RDW: 12.6 % (ref 12.3–15.4)
WBC: 11.2 10*3/uL — ABNORMAL HIGH (ref 3.4–10.8)

## 2017-03-24 LAB — ANTIBODY SCREEN: ANTIBODY SCREEN: NEGATIVE

## 2017-03-24 LAB — RPR: RPR: NONREACTIVE

## 2017-03-24 LAB — HIV ANTIBODY (ROUTINE TESTING W REFLEX): HIV SCREEN 4TH GENERATION: NONREACTIVE

## 2017-04-06 ENCOUNTER — Telehealth: Payer: Self-pay | Admitting: *Deleted

## 2017-04-06 ENCOUNTER — Encounter: Payer: Self-pay | Admitting: *Deleted

## 2017-04-06 NOTE — Telephone Encounter (Signed)
Mailbox full- Will send my chart message.  

## 2017-04-20 ENCOUNTER — Ambulatory Visit (INDEPENDENT_AMBULATORY_CARE_PROVIDER_SITE_OTHER): Payer: 59 | Admitting: Obstetrics and Gynecology

## 2017-04-20 ENCOUNTER — Encounter: Payer: Self-pay | Admitting: Obstetrics and Gynecology

## 2017-04-20 ENCOUNTER — Encounter: Payer: Self-pay | Admitting: Internal Medicine

## 2017-04-20 ENCOUNTER — Ambulatory Visit (INDEPENDENT_AMBULATORY_CARE_PROVIDER_SITE_OTHER): Payer: 59 | Admitting: Internal Medicine

## 2017-04-20 VITALS — BP 96/54 | HR 82 | Wt 149.0 lb

## 2017-04-20 VITALS — BP 118/60 | HR 99 | Temp 98.2°F | Wt 150.0 lb

## 2017-04-20 DIAGNOSIS — Z3403 Encounter for supervision of normal first pregnancy, third trimester: Secondary | ICD-10-CM

## 2017-04-20 DIAGNOSIS — Z1389 Encounter for screening for other disorder: Secondary | ICD-10-CM

## 2017-04-20 DIAGNOSIS — Z23 Encounter for immunization: Secondary | ICD-10-CM | POA: Diagnosis not present

## 2017-04-20 DIAGNOSIS — Z331 Pregnant state, incidental: Secondary | ICD-10-CM | POA: Diagnosis not present

## 2017-04-20 DIAGNOSIS — H9202 Otalgia, left ear: Secondary | ICD-10-CM

## 2017-04-20 DIAGNOSIS — Z3A30 30 weeks gestation of pregnancy: Secondary | ICD-10-CM

## 2017-04-20 LAB — POCT URINALYSIS DIPSTICK
Blood, UA: NEGATIVE
Glucose, UA: NEGATIVE
KETONES UA: NEGATIVE
Leukocytes, UA: NEGATIVE
Nitrite, UA: NEGATIVE
PROTEIN UA: NEGATIVE

## 2017-04-20 MED ORDER — NEOMYCIN-POLYMYXIN-HC 3.5-10000-1 OT SOLN: 4.0000 [drp] | Freq: Four times a day (QID) | OTIC | 0 refills | Status: DC

## 2017-04-20 NOTE — Patient Instructions (Signed)
Please check out [email or web address]   for more information on childbirth classes  ° °[phone] is the phone number for Pregnancy Classes or hospital tours at [facility].  ° °You will be referred to  [email or web address]   for more information on childbirth classes   °At this site you may register for classes. You may sign up for a waiting list if classes are full. Please SIGN UP FOR THIS!.   When the waiting list becomes long, sometimes new classes can be added. ° ° ° ° ° °

## 2017-04-20 NOTE — Patient Instructions (Signed)
Tammy Parks,  You do have an infection of your left ear canal. Use the antibiotic drops 3-4 times a day until you no longer have discomfort or up to 10 days.  For allergies, try nasal saline. You could also try zyrtec 10 mg.  Best, Dr. Sampson Goon   Otitis Externa Otitis externa is an infection of the outer ear canal. The outer ear canal is the area between the outside of the ear and the eardrum. Otitis externa is sometimes called "swimmer's ear." Follow these instructions at home:  If you were given antibiotic ear drops, use them as told by your doctor. Do not stop using them even if your condition gets better.  Take over-the-counter and prescription medicines only as told by your doctor.  Keep all follow-up visits as told by your doctor. This is important. How is this prevented?  Keep your ear dry. Use the corner of a towel to dry your ear after you swim or bathe.  Try not to scratch or put things in your ear. Doing these things makes it easier for germs to grow in your ear.  Avoid swimming in lakes, dirty water, or pools that may not have the right amount of a chemical called chlorine.  Consider making ear drops and putting 3 or 4 drops in each ear after you swim. Ask your doctor about how you can make ear drops. Contact a doctor if:  You have a fever.  After 3 days your ear is still red, swollen, or painful.  After 3 days you still have pus coming from your ear.  Your redness, swelling, or pain gets worse.  You have a really bad headache.  You have redness, swelling, pain, or tenderness behind your ear. This information is not intended to replace advice given to you by your health care provider. Make sure you discuss any questions you have with your health care provider. Document Released: 12/22/2007 Document Revised: 07/31/2015 Document Reviewed: 04/14/2015 Elsevier Interactive Patient Education  [employer].

## 2017-04-20 NOTE — Progress Notes (Signed)
Patient ID: Tammy Parks, female   DOB: 1996/03/07, 21 y.o.   MRN: [id number] G1P0  Estimated Date of Delivery: 06/23/17 [facility] [redacted]w[redacted]d  Chief Complaint  Patient presents with  . Routine Prenatal Visit  ____  Patient complaints: Pt reports today with her husband for a routine prenatal visit. Pt notes that she has not had a chance to sign up for child birthing classes because her and her husband have been working a lot. Patient reports good fetal movement, denies any bleeding, rupture of membranes, or regular contractions. Pt denies any symptoms or complaints at this time.  Blood pressure (!) 96/54, pulse 82, weight 149 lb (67.6 kg), last menstrual period 09/16/2016.   Urine results:notable for negative refer to the ob flow sheet for FH and FHR, ,                          Physical Examination: General appearance - alert, well appearing, and in no distress, oriented to person, place, and time and normal appearing weight                                      Abdomen - FH 32 cm,                                                         -FHR 147                           Questions were answered. Assessment: LROB G1P0 @ [redacted]w[redacted]d   Plan: 1. Continued routine obstetrical care,  2. Child Birthing Classes recommended  F/u in 2 weeks for LROB  By signing my name below, I, Diona Browner, attest that this documentation has been prepared under the direction and in the presence of Tilda Burrow, MD. Electronically Signed: Diona Browner, Medical Scribe. 04/20/17. 9:03 AM.  I personally performed the services described in this documentation, which was SCRIBED in my presence. The recorded information has been reviewed and considered accurate. It has been edited as necessary during review. Tilda Burrow, MD

## 2017-04-22 ENCOUNTER — Encounter: Payer: Self-pay | Admitting: Internal Medicine

## 2017-04-22 DIAGNOSIS — H9202 Otalgia, left ear: Secondary | ICD-10-CM | POA: Insufficient documentation

## 2017-04-22 NOTE — Assessment & Plan Note (Signed)
-   Patient with L external ear infection. Continues to have pain despite use of OTC ear drops. Prescribed cortisporin drops.  - Also recommended nasal saline drops for nasal congestion and provided handout on medications that are safe in pregnancy.

## 2017-04-22 NOTE — Progress Notes (Signed)
[facility] Progress Note  Subjective:  Tammy Parks is a 21 y.o. female who is [redacted] weeks pregnant presenting for left ear pain. Began having pain over the weekend. Denies drainage. Has been taking tylenol prn that helps. It hurt to chew on Sunday but since improved. Also endorses nasal congestion. Has tried 2 over-the-counter ear drops--one for swimmer's ear and another she cannot remember the name of. Neither seemed to help much. No recent swimming. No sick contacts. ROS: No fever, no sore throat  No Known Allergies  Objective: Blood pressure 118/60, pulse 99, temperature 98.2 F (36.8 C), temperature source Oral, weight 150 lb (68 kg), last menstrual period 09/16/2016, SpO2 98 %. Constitutional: Well-appearing female in NAD HENT: Swollen and erythematous nasal turbinates. TMs normal bilaterally but erythema and swelling of left ear canal with small amount of debris. Mild TTP over retropharyngeal lymph node on L.  Cardiovascular: RRR, S1, S2, no m/r/g.  Pulmonary/Chest: Effort normal and breath sounds normal. No respiratory distress.  Abdominal: Gravid Vitals reviewed  Assessment/Plan: Left ear pain - Patient with L external ear infection. Continues to have pain despite use of OTC ear drops. Prescribed cortisporin drops.  - Also recommended nasal saline drops for nasal congestion and provided handout on medications that are safe in pregnancy.  Patient requested TDAP. This was administered.   Follow-up prn.  Dani Gobble, MD [facility], PGY-3

## 2017-05-04 ENCOUNTER — Encounter: Payer: Self-pay | Admitting: Advanced Practice Midwife

## 2017-05-04 ENCOUNTER — Ambulatory Visit (INDEPENDENT_AMBULATORY_CARE_PROVIDER_SITE_OTHER): Payer: 59 | Admitting: Advanced Practice Midwife

## 2017-05-04 VITALS — BP 120/60 | HR 90 | Wt 152.0 lb

## 2017-05-04 DIAGNOSIS — Z331 Pregnant state, incidental: Secondary | ICD-10-CM

## 2017-05-04 DIAGNOSIS — Z3A32 32 weeks gestation of pregnancy: Secondary | ICD-10-CM

## 2017-05-04 DIAGNOSIS — Z1389 Encounter for screening for other disorder: Secondary | ICD-10-CM

## 2017-05-04 DIAGNOSIS — Z3403 Encounter for supervision of normal first pregnancy, third trimester: Secondary | ICD-10-CM

## 2017-05-04 LAB — POCT URINALYSIS DIPSTICK
Glucose, UA: NEGATIVE
Ketones, UA: NEGATIVE
Leukocytes, UA: NEGATIVE
Nitrite, UA: NEGATIVE
PROTEIN UA: NEGATIVE
RBC UA: NEGATIVE

## 2017-05-04 MED ORDER — ONDANSETRON 4 MG PO TBDP: 4.0000 mg | ORAL_TABLET | Freq: Four times a day (QID) | ORAL | 2 refills | Status: DC | PRN

## 2017-05-04 MED ORDER — OMEPRAZOLE 20 MG PO CPDR: 20.0000 mg | DELAYED_RELEASE_CAPSULE | Freq: Two times a day (BID) | ORAL | 6 refills | Status: DC

## 2017-05-04 NOTE — Progress Notes (Signed)
G1P0 7394w6d Estimated Date of Delivery: 06/23/17  Blood pressure 120/60, pulse 90, weight 152 lb (68.9 kg), last menstrual period 09/16/2016.   BP weight and urine results all reviewed and noted.  Please refer to the obstetrical flow sheet for the fundal height and fetal heart rate documentation:  Patient reports good fetal movement, denies any bleeding and no rupture of membranes symptoms or regular contractions. Patient still has morning sickness and heartburn.  All questions were answered.  Orders Placed This Encounter  Procedures  . POCT Urinalysis Dipstick    Plan:  Continued routine obstetrical care,   Return in about 2 weeks (around 05/18/2017) for LROB.

## 2017-05-04 NOTE — Patient Instructions (Signed)

## 2017-05-09 ENCOUNTER — Telehealth: Payer: Self-pay | Admitting: *Deleted

## 2017-05-09 ENCOUNTER — Ambulatory Visit (INDEPENDENT_AMBULATORY_CARE_PROVIDER_SITE_OTHER): Payer: 59 | Admitting: Obstetrics and Gynecology

## 2017-05-09 VITALS — BP 124/60 | HR 110 | Wt 154.2 lb

## 2017-05-09 DIAGNOSIS — Z1389 Encounter for screening for other disorder: Secondary | ICD-10-CM

## 2017-05-09 DIAGNOSIS — Z331 Pregnant state, incidental: Secondary | ICD-10-CM

## 2017-05-09 DIAGNOSIS — Z3A33 33 weeks gestation of pregnancy: Secondary | ICD-10-CM

## 2017-05-09 DIAGNOSIS — Z3403 Encounter for supervision of normal first pregnancy, third trimester: Secondary | ICD-10-CM

## 2017-05-09 LAB — POCT URINALYSIS DIPSTICK
GLUCOSE UA: NEGATIVE
Ketones, UA: NEGATIVE
Leukocytes, UA: NEGATIVE
NITRITE UA: NEGATIVE
PROTEIN UA: NEGATIVE
RBC UA: NEGATIVE

## 2017-05-09 NOTE — Progress Notes (Signed)
Patient ID: Tammy PaulsSamantha Allison Parks, female   DOB: Oct 28, 1995, 21 y.o.   MRN: [id number] G1P0  Estimated Date of Delivery: 06/23/17 [facility]  Chief Complaint  Patient presents with   Routine Prenatal Visit   Vaginal Bleeding  ____  Patient complaints: Pt reports vaginal bleeding that started last night. She felt that she was going to have a bowel movement, but only urinated. When she wiped she noticed BRB. Pt notes that it feels like abdominal cramps. Patient reports good fetal movement, denies rupture of membranes,or regular contractions.  Blood pressure 124/60, pulse (!) 110, weight 154 lb 3.2 oz (69.9 kg), last menstrual period 09/16/2016.   Urine results:notable for negative refer to the ob flow sheet for FH and FHR, ,                          Physical Examination: General appearance - alert, well appearing, and in no distress and oriented to person, place, and time Abdomen - FH: not done                -FHR: 143 Pelvic - normal external genitalia,   VAGINA: normal secretions, erythematous 1 cm in length at 9 o'clock and 12 o'clock at introitus, suspected to be the cause of bleeding CERVIX: long, closed absolutely no blood per vagina                                            Questions were answered. Assessment: LROB G1P0 @ 5866w4d   Plan:  Continued routine obstetrical care,   F/u in 2 weeks for LROB  By signing my name below, I, Diona BrownerJennifer Gorman, attest that this documentation has been prepared under the direction and in the presence of Tilda BurrowFerguson, John V, MD. Electronically Signed: Diona BrownerJennifer Gorman, Medical Scribe. 05/09/17. 3:19 PM.  I personally performed the services described in this documentation, which was SCRIBED in my presence. The recorded information has been reviewed and considered accurate. It has been edited as necessary during review. Tilda BurrowFERGUSON,JOHN V, MD

## 2017-05-09 NOTE — Telephone Encounter (Signed)
Patient called to inform that she had 1 episode of bright red bleeding last night around 11pm. She has been having some cramping and back pain but nothing today and has not been sexually active recently. Baby is active and no leakage of fluid. Informed patient that since it was 1 time to continue to monitor and to push fluids, empty bladder frequently and rest when able and to notify us if bleeding, cramping or change in fetal movement is noticed. Pt verbalized understanding. Will inform provider and call patient if she needs to be evaluated.

## 2017-05-18 ENCOUNTER — Encounter: Payer: 59 | Admitting: Women's Health

## 2017-05-23 ENCOUNTER — Encounter: Payer: Self-pay | Admitting: Obstetrics and Gynecology

## 2017-05-23 ENCOUNTER — Ambulatory Visit (INDEPENDENT_AMBULATORY_CARE_PROVIDER_SITE_OTHER): Payer: 59 | Admitting: Obstetrics and Gynecology

## 2017-05-23 VITALS — BP 130/70 | HR 116 | Wt 156.0 lb

## 2017-05-23 DIAGNOSIS — Z3403 Encounter for supervision of normal first pregnancy, third trimester: Secondary | ICD-10-CM

## 2017-05-23 DIAGNOSIS — Z331 Pregnant state, incidental: Secondary | ICD-10-CM

## 2017-05-23 DIAGNOSIS — Z3A35 35 weeks gestation of pregnancy: Secondary | ICD-10-CM

## 2017-05-23 DIAGNOSIS — Z1389 Encounter for screening for other disorder: Secondary | ICD-10-CM

## 2017-05-23 LAB — POCT URINALYSIS DIPSTICK
GLUCOSE UA: NEGATIVE
KETONES UA: NEGATIVE
Nitrite, UA: NEGATIVE
Protein, UA: NEGATIVE
RBC UA: NEGATIVE

## 2017-05-23 NOTE — Progress Notes (Signed)
Patient ID: Lillia PaulsSamantha Allison Anello, female   DOB: Oct 08, 1995, 21 y.o.   MRN: [id number] G1P0  [facility]  Chief Complaint  Patient presents with  . Routine Prenatal Visit  ____  Patient complaints: She reports that the bleeding she experienced prior to her last visit on 05/09/2017, has stopped. Otherwise she denies any other symptoms or complaints at this time. Patient reports good fetal movement, denies any bleeding, rupture of membranes,or regular contractions.  Blood pressure 130/70, pulse (!) 116, weight 156 lb (70.8 kg), last menstrual period 09/16/2016.   Urine results:notable for trace leukocytes, otherwise negative refer to the ob flow sheet for FH and FHR, ,                          Physical Examination: General appearance - alert, well appearing, and in no distress and oriented to person, place, and time                                      Abdomen - FH 35 cm,                                                         -FHR 158                                                                                                   Questions were answered. Assessment: LROB G1P0 @ 3959w4d   Plan:  Continued routine obstetrical care,   F/u in 1 weeks for LROB, GBS  By signing my name below, I, Diona BrownerJennifer Gorman, attest that this documentation has been prepared under the direction and in the presence of Tilda BurrowFerguson, Asah Lamay V, MD. Electronically Signed: Diona BrownerJennifer Gorman, Medical Scribe. 05/23/17. 9:29 AM.  I personally performed the services described in this documentation, which was SCRIBED in my presence. The recorded information has been reviewed and considered accurate. It has been edited as necessary during review. Tilda BurrowFERGUSON,Korey Arroyo V, MD

## 2017-05-23 NOTE — Patient Instructions (Signed)
(  [phone] is the phone number for Pregnancy Classes or hospital tours at [facility].  ° °You will be referred to  [email or web address]   for more information on childbirth classes   °At this site you may register for classes. You may sign up for a waiting list if classes are full. Please SIGN UP FOR THIS!.   When the waiting list becomes long, sometimes new classes can be added. ° ° ° °

## 2017-05-30 ENCOUNTER — Ambulatory Visit (INDEPENDENT_AMBULATORY_CARE_PROVIDER_SITE_OTHER): Payer: 59 | Admitting: Obstetrics and Gynecology

## 2017-05-30 VITALS — BP 122/66 | HR 100 | Wt 158.8 lb

## 2017-05-30 DIAGNOSIS — Z3A36 36 weeks gestation of pregnancy: Secondary | ICD-10-CM | POA: Diagnosis not present

## 2017-05-30 DIAGNOSIS — Z3403 Encounter for supervision of normal first pregnancy, third trimester: Secondary | ICD-10-CM | POA: Diagnosis not present

## 2017-05-30 DIAGNOSIS — Z331 Pregnant state, incidental: Secondary | ICD-10-CM

## 2017-05-30 DIAGNOSIS — Z1389 Encounter for screening for other disorder: Secondary | ICD-10-CM

## 2017-05-30 LAB — POCT URINALYSIS DIPSTICK
Blood, UA: NEGATIVE
GLUCOSE UA: NEGATIVE
KETONES UA: NEGATIVE
LEUKOCYTES UA: NEGATIVE
NITRITE UA: NEGATIVE
Protein, UA: NEGATIVE

## 2017-05-30 NOTE — Progress Notes (Signed)
Tammy Parks is a 21 y.o. female G1P0  Estimated Date of Delivery: 06/23/17 LROB 7361w4d Patient seen with Dr. Artist Paisyoo who is her primary care Chief Complaint  Patient presents with  . Routine Prenatal Visit  ____  Patient has no complaints. She reports feeling increased pressure in the  Lower pelvic area. She has not been to childbirth classes yet, but has taken a virtual tour of women's.  Patient reports  good fetal movement,                          denies any bleeding , rupture of membranes,or regular contractions.  Blood pressure 122/66, pulse 100, weight 158 lb 12.8 oz (72 kg), last menstrual period 09/16/2016.   Urine results: notable for none refer to the ob flow sheet for FH and FHR,                          Physical Examination: General appearance - alert, well appearing, and in no distress                                      Abdomen - FH 35                                                        -FHR 149                                                         soft, nontender, nondistended, no masses or organomegaly                                      Pelvic - GBS taken, soft, closed, 50%, -2,  Small skin tag present                                            Questions were answered. Assessment: LROB G1P0 @ 1961w4d Estimated Date of Delivery: 06/23/17 GBS done  Plan:  Continued routine obstetrical care, LROB  F/u in 1 week for LROB   By signing my name below, I, Tammy Parks, attest that this documentation has been prepared under the direction and in the presence of Tilda BurrowFerguson, Querida Beretta V, MD. Electronically Signed: Redge GainerIzna Parks, Medical Scribe. 05/30/17. 3:17 PM.  I personally performed the services described in this documentation, which was SCRIBED in my presence. The recorded information has been reviewed and considered accurate. It has been edited as necessary during review. Tilda BurrowJohn V Tykel Badie, MD

## 2017-06-01 LAB — GC/CHLAMYDIA PROBE AMP
CHLAMYDIA, DNA PROBE: NEGATIVE
Neisseria gonorrhoeae by PCR: NEGATIVE

## 2017-06-03 LAB — CULTURE, BETA STREP (GROUP B ONLY): STREP GP B CULTURE: NEGATIVE

## 2017-06-03 LAB — OB RESULTS CONSOLE GBS: STREP GROUP B AG: NEGATIVE

## 2017-06-07 ENCOUNTER — Ambulatory Visit (INDEPENDENT_AMBULATORY_CARE_PROVIDER_SITE_OTHER): Payer: 59 | Admitting: Obstetrics & Gynecology

## 2017-06-07 ENCOUNTER — Encounter: Payer: Self-pay | Admitting: Obstetrics & Gynecology

## 2017-06-07 ENCOUNTER — Other Ambulatory Visit: Payer: Self-pay

## 2017-06-07 VITALS — BP 110/66 | HR 76 | Wt 162.0 lb

## 2017-06-07 DIAGNOSIS — Z3A37 37 weeks gestation of pregnancy: Secondary | ICD-10-CM

## 2017-06-07 DIAGNOSIS — Z331 Pregnant state, incidental: Secondary | ICD-10-CM

## 2017-06-07 DIAGNOSIS — Z1389 Encounter for screening for other disorder: Secondary | ICD-10-CM

## 2017-06-07 DIAGNOSIS — Z3403 Encounter for supervision of normal first pregnancy, third trimester: Secondary | ICD-10-CM

## 2017-06-07 LAB — POCT URINALYSIS DIPSTICK
GLUCOSE UA: NEGATIVE
KETONES UA: NEGATIVE
Nitrite, UA: NEGATIVE
Protein, UA: NEGATIVE
RBC UA: NEGATIVE

## 2017-06-07 NOTE — Progress Notes (Signed)
G1P0 5984w5d Estimated Date of Delivery: 06/23/17  Blood pressure 110/66, pulse 76, weight 162 lb (73.5 kg), last menstrual period 09/16/2016.   BP weight and urine results all reviewed and noted.  Please refer to the obstetrical flow sheet for the fundal height and fetal heart rate documentation:  Patient reports good fetal movement, denies any bleeding and no rupture of membranes symptoms or regular contractions. Patient is without complaints. All questions were answered.  Orders Placed This Encounter  Procedures  . POCT urinalysis dipstick    Plan:  Continued routine obstetrical care, cx LTC soft post  Return in about 1 week (around 06/14/2017) for LROB.

## 2017-06-15 ENCOUNTER — Ambulatory Visit (INDEPENDENT_AMBULATORY_CARE_PROVIDER_SITE_OTHER): Payer: 59 | Admitting: Advanced Practice Midwife

## 2017-06-15 VITALS — BP 120/80 | HR 94 | Wt 163.0 lb

## 2017-06-15 DIAGNOSIS — Z3A38 38 weeks gestation of pregnancy: Secondary | ICD-10-CM

## 2017-06-15 DIAGNOSIS — O480 Post-term pregnancy: Secondary | ICD-10-CM

## 2017-06-15 DIAGNOSIS — Z3403 Encounter for supervision of normal first pregnancy, third trimester: Secondary | ICD-10-CM

## 2017-06-15 DIAGNOSIS — Z1389 Encounter for screening for other disorder: Secondary | ICD-10-CM

## 2017-06-15 DIAGNOSIS — Z331 Pregnant state, incidental: Secondary | ICD-10-CM

## 2017-06-15 LAB — POCT URINALYSIS DIPSTICK
Glucose, UA: NEGATIVE
KETONES UA: NEGATIVE
Leukocytes, UA: NEGATIVE
NITRITE UA: NEGATIVE
Protein, UA: NEGATIVE
RBC UA: NEGATIVE

## 2017-06-15 NOTE — Progress Notes (Signed)
G1P0 7658w6d Estimated Date of Delivery: 06/23/17  Blood pressure 120/80, pulse 94, weight 163 lb (73.9 kg), last menstrual period 09/16/2016.   BP weight and urine results all reviewed and noted.  Please refer to the obstetrical flow sheet for the fundal height and fetal heart rate documentation:  Patient reports good fetal movement, denies any bleeding other than a few spots and no rupture of membranes symptoms or regular contractions. Patient is without complaints. All questions were answered.  Orders Placed This Encounter  Procedures  . POCT Urinalysis Dipstick    Plan:  Continued routine obstetrical care,   Return in 8 days (on 06/23/2017) for LROB bpp efw.

## 2017-06-15 NOTE — Patient Instructions (Signed)

## 2017-06-23 ENCOUNTER — Ambulatory Visit (INDEPENDENT_AMBULATORY_CARE_PROVIDER_SITE_OTHER): Payer: 59

## 2017-06-23 ENCOUNTER — Telehealth (HOSPITAL_COMMUNITY): Payer: Self-pay | Admitting: *Deleted

## 2017-06-23 ENCOUNTER — Ambulatory Visit (INDEPENDENT_AMBULATORY_CARE_PROVIDER_SITE_OTHER): Payer: 59 | Admitting: Advanced Practice Midwife

## 2017-06-23 VITALS — BP 130/88 | HR 93 | Wt 166.0 lb

## 2017-06-23 DIAGNOSIS — N939 Abnormal uterine and vaginal bleeding, unspecified: Secondary | ICD-10-CM | POA: Diagnosis not present

## 2017-06-23 DIAGNOSIS — O480 Post-term pregnancy: Secondary | ICD-10-CM

## 2017-06-23 DIAGNOSIS — Z3A40 40 weeks gestation of pregnancy: Secondary | ICD-10-CM | POA: Diagnosis not present

## 2017-06-23 DIAGNOSIS — Z1389 Encounter for screening for other disorder: Secondary | ICD-10-CM

## 2017-06-23 DIAGNOSIS — Z3403 Encounter for supervision of normal first pregnancy, third trimester: Secondary | ICD-10-CM

## 2017-06-23 DIAGNOSIS — Z331 Pregnant state, incidental: Secondary | ICD-10-CM

## 2017-06-23 LAB — POCT URINALYSIS DIPSTICK
Blood, UA: NEGATIVE
Glucose, UA: NEGATIVE
KETONES UA: NEGATIVE
LEUKOCYTES UA: NEGATIVE
Nitrite, UA: NEGATIVE
Protein, UA: NEGATIVE

## 2017-06-23 NOTE — Telephone Encounter (Signed)
Preadmission screen  

## 2017-06-23 NOTE — Progress Notes (Signed)
US 40 wks,cephalic,BPP 8/8,FHR 144 BPM,posterior pl gr 3,bilat adnexa's wnl,afi 23 cm,EFW 3462 g 50% limited head measurement because of fetal position

## 2017-06-23 NOTE — Progress Notes (Signed)
LOW-RISK PREGNANCY VISIT Patient name: Tammy Parks MRN [id number]  Date of birth: [birth year] Chief Complaint:   Routine Prenatal Visit  History of Present Illness:   Tammy Parks is a 21 y.o. G1P0 female at 8446w0d with an Estimated Date of Delivery: 06/23/17 being seen today for ongoing management of a low-risk pregnancy.  Today she reports no complaints. Contractions: Irregular. Vag. Bleeding: None.  Movement: Present. denies leaking of fluid. Review of Systems:   Pertinent items are noted in HPI Denies abnormal vaginal discharge w/ itching/odor/irritation, headaches, visual changes, shortness of breath, chest pain, abdominal pain, severe nausea/vomiting, or problems with urination or bowel movements unless otherwise stated above.  Pertinent History Reviewed:  Medical & Surgical Hx:   Past Medical History:  Diagnosis Date  . Scoliosis    Past Surgical History:  Procedure Laterality Date  . WISDOM TOOTH EXTRACTION     Family History  Problem Relation Age of Onset  . Cancer Paternal Grandmother   . Thyroid disease Paternal Grandmother   . Breast cancer Maternal Grandmother   . Cancer Maternal Uncle   . ADD / ADHD Brother   . Diabetes Maternal Grandfather   . Thyroid disease Paternal Aunt     Current Outpatient Medications:  .  calcium carbonate (TUMS - DOSED IN MG ELEMENTAL CALCIUM) 500 MG chewable tablet, Chew 1 tablet as needed by mouth for indigestion or heartburn., Disp: , Rfl:  .  Prenatal Vit-Fe Fumarate-FA (MULTIVITAMIN-PRENATAL) 27-0.8 MG TABS tablet, Take 1 tablet by mouth daily at 12 noon., Disp: , Rfl:  .  ranitidine (ZANTAC) 150 MG tablet, Take 150 mg as needed by mouth. , Disp: , Rfl:  .  promethazine (PHENERGAN) 25 MG tablet, Take 0.5-1 tablets (12.5-25 mg total) by mouth every 6 (six) hours as needed for nausea or vomiting. (Patient not taking: Reported on 06/23/2017), Disp: 30 tablet, Rfl: 0 Social History: Reviewed -  reports that she has quit  smoking. Her smoking use included cigarettes. She smoked 1.00 pack per day. she has never used smokeless tobacco.  Physical Assessment:   Vitals:   06/23/17 1050  BP: 130/88  Pulse: 93  Weight: 166 lb (75.3 kg)  Body mass index is 30.36 kg/m.        Physical Examination:   General appearance: Well appearing, and in no distress  Mental status: Alert, oriented to person, place, and time  Skin: Warm & dry  Cardiovascular: Normal heart rate noted  Respiratory: Normal respiratory effort, no distress  Abdomen: Soft, gravid, nontender  Pelvic: Cervical exam performed  Dilation: Closed Effacement (%): 80 Station: -1  Extremities: Edema: Trace  Fetal Status: Fetal Heart Rate (bpm): 145   Movement: Present Presentation: Vertex  Results for orders placed or performed in visit on 06/23/17 (from the past 24 hour(s))  POCT urinalysis dipstick   Collection Time: 06/23/17 10:52 AM  Result Value Ref Range   Color, UA     Clarity, UA     Glucose, UA neg    Bilirubin, UA     Ketones, UA neg    Spec Grav, UA  1.010 - 1.025   Blood, UA neg    pH, UA  5.0 - 8.0   Protein, UA neg    Urobilinogen, UA  0.2 or 1.0 E.U./dL   Nitrite, UA neg    Leukocytes, UA Negative Negative    Assessment & Plan:  1) Low-risk pregnancy G1P0 at 146w0d with an Estimated Date of Delivery: 06/23/17   2)  post dates:  IOL 12/14 [place], orders in,  3) borderline DBP; L[facility] said recheck monday   Labs/procedures/US today:    Plan:  Continue routine obstetrical care    Follow-up: Return for monday for BP check.  Orders Placed This Encounter  Procedures  . POCT urinalysis dipstick   CRESENZO-DISHMAN,Taysha Majewski CNM 06/23/2017 11:22 AM

## 2017-06-23 NOTE — Progress Notes (Signed)
   Induction Assessment Scheduling Form: Fax to [facility]:  [phone]  [name]                                                                                   DOB:  10-24-1995                                                            MRN:  [id number]                                                                     Phone #:   [phone]                         Provider:  [facility]  GP:  G1P0                                                            Estimated Date of Delivery: 06/23/17  Dating Criteria: 7 week US    Medical Indications for induction:  postdates Admission Date/Time:  12/15 at [facility] Gestational age on admission:  9241.1   Filed Weights   06/23/17 1050  Weight: 166 lb (75.3 kg)    HIV:    GBS:  neg  Closed/short/-2 -  Method of induction(proposed):  cytotec   Scheduling Provider Signature:  Greig RightRESENZO-DISHMAN,Tammy Parks, CNM                                            Today's Date:  06/23/2017

## 2017-06-23 NOTE — Patient Instructions (Signed)
If you are still pregnant on 12/14 (Friday) at 11:45PM , come to [facility]missions Unit ([address] [place] to start your induction!  Eat a light meal before you come.  Tammy MustGood Luck!!

## 2017-06-27 ENCOUNTER — Other Ambulatory Visit: Payer: Self-pay | Admitting: Advanced Practice Midwife

## 2017-06-27 ENCOUNTER — Encounter: Payer: 59 | Admitting: Obstetrics & Gynecology

## 2017-06-28 ENCOUNTER — Encounter (HOSPITAL_COMMUNITY): Payer: Self-pay | Admitting: *Deleted

## 2017-06-28 ENCOUNTER — Inpatient Hospital Stay (HOSPITAL_COMMUNITY)
Admission: AD | Admit: 2017-06-28 | Discharge: 2017-06-30 | DRG: 807 | Disposition: A | Discharge status: Home / Self Care | Payer: 59 | Source: Ambulatory Visit | Attending: Obstetrics & Gynecology | Admitting: Obstetrics & Gynecology

## 2017-06-28 ENCOUNTER — Other Ambulatory Visit: Payer: Self-pay

## 2017-06-28 ENCOUNTER — Inpatient Hospital Stay (HOSPITAL_COMMUNITY): Payer: 59 | Admitting: Anesthesiology

## 2017-06-28 DIAGNOSIS — O4292 Full-term premature rupture of membranes, unspecified as to length of time between rupture and onset of labor: Secondary | ICD-10-CM | POA: Diagnosis present

## 2017-06-28 DIAGNOSIS — Z3A40 40 weeks gestation of pregnancy: Secondary | ICD-10-CM

## 2017-06-28 DIAGNOSIS — Z87891 Personal history of nicotine dependence: Secondary | ICD-10-CM

## 2017-06-28 DIAGNOSIS — O480 Post-term pregnancy: Secondary | ICD-10-CM | POA: Diagnosis present

## 2017-06-28 DIAGNOSIS — M419 Scoliosis, unspecified: Secondary | ICD-10-CM | POA: Diagnosis present

## 2017-06-28 DIAGNOSIS — Z8759 Personal history of other complications of pregnancy, childbirth and the puerperium: Secondary | ICD-10-CM

## 2017-06-28 DIAGNOSIS — O134 Gestational [pregnancy-induced] hypertension without significant proteinuria, complicating childbirth: Secondary | ICD-10-CM | POA: Diagnosis present

## 2017-06-28 DIAGNOSIS — Z3403 Encounter for supervision of normal first pregnancy, third trimester: Secondary | ICD-10-CM

## 2017-06-28 LAB — CBC
HCT: 34 % — ABNORMAL LOW (ref 36.0–46.0)
HEMOGLOBIN: 11 g/dL — AB (ref 12.0–15.0)
MCH: 26.7 pg (ref 26.0–34.0)
MCHC: 32.4 g/dL (ref 30.0–36.0)
MCV: 82.5 fL (ref 78.0–100.0)
PLATELETS: 327 10*3/uL (ref 150–400)
RBC: 4.12 MIL/uL (ref 3.87–5.11)
RDW: 14.1 % (ref 11.5–15.5)
WBC: 10.9 10*3/uL — AB (ref 4.0–10.5)

## 2017-06-28 LAB — COMPREHENSIVE METABOLIC PANEL
ALT: 13 U/L — ABNORMAL LOW (ref 14–54)
ANION GAP: 11 (ref 5–15)
AST: 21 U/L (ref 15–41)
Albumin: 2.7 g/dL — ABNORMAL LOW (ref 3.5–5.0)
Alkaline Phosphatase: 186 U/L — ABNORMAL HIGH (ref 38–126)
BUN: 13 mg/dL (ref 6–20)
CALCIUM: 9.1 mg/dL (ref 8.9–10.3)
CHLORIDE: 106 mmol/L (ref 101–111)
CO2: 19 mmol/L — AB (ref 22–32)
Creatinine, Ser: 0.71 mg/dL (ref 0.44–1.00)
GFR calc non Af Amer: >60 mL/min (ref >60)
Glucose, Bld: 87 mg/dL (ref 65–99)
POTASSIUM: 4 mmol/L (ref 3.5–5.1)
SODIUM: 136 mmol/L (ref 135–145)
Total Bilirubin: 0.4 mg/dL (ref 0.3–1.2)
Total Protein: 5.7 g/dL — ABNORMAL LOW (ref 6.5–8.1)

## 2017-06-28 LAB — PROTEIN / CREATININE RATIO, URINE
Creatinine, Urine: 201 mg/dL
PROTEIN CREATININE RATIO: 0.26 mg/mg{creat} — AB (ref 0.00–0.15)
TOTAL PROTEIN, URINE: 52 mg/dL

## 2017-06-28 LAB — TYPE AND SCREEN
ABO/RH(D): O POS
Antibody Screen: NEGATIVE

## 2017-06-28 LAB — ABO/RH: ABO/RH(D): O POS

## 2017-06-28 LAB — POCT FERN TEST: POCT Fern Test: POSITIVE

## 2017-06-28 MED ORDER — FENTANYL 2.5 MCG/ML BUPIVACAINE 1/10 % EPIDURAL INFUSION (WH - ANES)
14.0000 mL/h | INTRAMUSCULAR | Status: DC | PRN
Start: 2017-06-28 — End: 2017-06-29
  Administered 2017-06-28 – 2017-06-29 (×3): 14 mL/h via EPIDURAL
  Filled 2017-06-28 (×3): qty 100

## 2017-06-28 MED ORDER — LACTATED RINGERS IV SOLN
500.0000 mL | Freq: Once | INTRAVENOUS | Status: AC
  Administered 2017-06-28: 500 mL via INTRAVENOUS

## 2017-06-28 MED ORDER — LACTATED RINGERS IV SOLN: 500.0000 mL | INTRAVENOUS | Status: DC | PRN

## 2017-06-28 MED ORDER — OXYCODONE-ACETAMINOPHEN 5-325 MG PO TABS: 2.0000 | ORAL_TABLET | ORAL | Status: DC | PRN

## 2017-06-28 MED ORDER — LIDOCAINE HCL (PF) 1 % IJ SOLN
30.0000 mL | INTRAMUSCULAR | Status: DC | PRN
  Filled 2017-06-28: qty 30

## 2017-06-28 MED ORDER — LACTATED RINGERS IV SOLN
INTRAVENOUS | Status: DC
  Administered 2017-06-28 – 2017-06-29 (×3): via INTRAVENOUS

## 2017-06-28 MED ORDER — SOD CITRATE-CITRIC ACID 500-334 MG/5ML PO SOLN
30.0000 mL | ORAL | Status: DC | PRN
  Filled 2017-06-28: qty 15

## 2017-06-28 MED ORDER — DIPHENHYDRAMINE HCL 50 MG/ML IJ SOLN: 12.5000 mg | INTRAMUSCULAR | Status: DC | PRN

## 2017-06-28 MED ORDER — EPHEDRINE 5 MG/ML INJ
10.0000 mg | INTRAVENOUS | Status: DC | PRN
  Filled 2017-06-28: qty 2

## 2017-06-28 MED ORDER — ZOLPIDEM TARTRATE 5 MG PO TABS: 5.0000 mg | ORAL_TABLET | Freq: Every evening | ORAL | Status: DC | PRN

## 2017-06-28 MED ORDER — MISOPROSTOL 25 MCG QUARTER TABLET: 25.0000 ug | ORAL_TABLET | ORAL | Status: DC | PRN

## 2017-06-28 MED ORDER — OXYTOCIN BOLUS FROM INFUSION
500.0000 mL | Freq: Once | INTRAVENOUS | Status: AC
  Administered 2017-06-29: 500 mL via INTRAVENOUS

## 2017-06-28 MED ORDER — FLEET ENEMA 7-19 GM/118ML RE ENEM: 1.0000 | ENEMA | RECTAL | Status: DC | PRN

## 2017-06-28 MED ORDER — OXYCODONE-ACETAMINOPHEN 5-325 MG PO TABS: 1.0000 | ORAL_TABLET | ORAL | Status: DC | PRN

## 2017-06-28 MED ORDER — TERBUTALINE SULFATE 1 MG/ML IJ SOLN
0.2500 mg | Freq: Once | INTRAMUSCULAR | Status: DC | PRN
  Filled 2017-06-28: qty 1

## 2017-06-28 MED ORDER — PHENYLEPHRINE 40 MCG/ML (10ML) SYRINGE FOR IV PUSH (FOR BLOOD PRESSURE SUPPORT)
80.0000 ug | PREFILLED_SYRINGE | INTRAVENOUS | Status: DC | PRN
  Filled 2017-06-28: qty 5

## 2017-06-28 MED ORDER — FENTANYL CITRATE (PF) 100 MCG/2ML IJ SOLN: 50.0000 ug | INTRAMUSCULAR | Status: DC | PRN

## 2017-06-28 MED ORDER — MISOPROSTOL 50MCG HALF TABLET
50.0000 ug | ORAL_TABLET | ORAL | Status: DC | PRN
  Administered 2017-06-28: 50 ug via BUCCAL
  Filled 2017-06-28: qty 1

## 2017-06-28 MED ORDER — OXYTOCIN 40 UNITS IN LACTATED RINGERS INFUSION - SIMPLE MED
2.5000 [IU]/h | INTRAVENOUS | Status: DC
Start: 2017-06-28 — End: 2017-06-29
  Filled 2017-06-28: qty 1000

## 2017-06-28 MED ORDER — ACETAMINOPHEN 325 MG PO TABS
650.0000 mg | ORAL_TABLET | ORAL | Status: DC | PRN
  Administered 2017-06-28 – 2017-06-29 (×2): 650 mg via ORAL
  Filled 2017-06-28 (×2): qty 2

## 2017-06-28 MED ORDER — ONDANSETRON HCL 4 MG/2ML IJ SOLN: 4.0000 mg | Freq: Four times a day (QID) | INTRAMUSCULAR | Status: DC | PRN

## 2017-06-28 MED ORDER — TERBUTALINE SULFATE 1 MG/ML IJ SOLN: 0.2500 mg | Freq: Once | INTRAMUSCULAR | Status: DC | PRN

## 2017-06-28 MED ORDER — LIDOCAINE HCL (PF) 1 % IJ SOLN
INTRAMUSCULAR | Status: DC | PRN
  Administered 2017-06-28 (×2): 5 mL

## 2017-06-28 MED ORDER — PHENYLEPHRINE 40 MCG/ML (10ML) SYRINGE FOR IV PUSH (FOR BLOOD PRESSURE SUPPORT)
80.0000 ug | PREFILLED_SYRINGE | INTRAVENOUS | Status: DC | PRN
  Filled 2017-06-28: qty 5
  Filled 2017-06-28: qty 10

## 2017-06-28 MED ORDER — OXYTOCIN 40 UNITS IN LACTATED RINGERS INFUSION - SIMPLE MED
1.0000 m[IU]/min | INTRAVENOUS | Status: DC
Start: 2017-06-28 — End: 2017-06-29
  Administered 2017-06-28: 2 m[IU]/min via INTRAVENOUS

## 2017-06-28 NOTE — MAU Note (Signed)
Urine in lab 

## 2017-06-28 NOTE — Progress Notes (Signed)
Labor Progress Note  Tammy PaulsSamantha Allison Parks is a 21 y.o. G1P0 at 7527w5d  admitted for rupture of membranes  S: Comfortable with epidural. Denies headache (resolved with Tylenol). No other reported PIH symptoms. Feeling pressure.    O:  BP 124/80   Pulse 84   Temp 97.9 F (36.6 C) (Oral)   Resp 18   Ht 5\' 2"  (1.575 m)   Wt 75.8 kg (167 lb)   LMP 09/16/2016 (Exact Date)   SpO2 99%   BMI 30.54 kg/m   No intake/output data recorded.  FHT:  FHR: 125 bpm, variability: moderate,  accelerations:  Present,  decelerations:  Absent UC:   regular, every 1-4 minutes SVE:   Dilation: 2.5 Effacement (%): 80 Station: +1 Exam by:: erin hampton rnc SROM   Labs: Lab Results  Component Value Date   WBC 10.9 (H) 06/28/2017   HGB 11.0 (L) 06/28/2017   HCT 34.0 (L) 06/28/2017   MCV 82.5 06/28/2017   PLT 327 06/28/2017    Assessment / Plan: 21 y.o. G1P0 6527w5d in early labor Augmentation of labor, progressing well  Labor: Progressing normally, continue cytotec  Fetal Wellbeing:  Category I  Elevated BPs: improved s/p epidural. PIH labs negative, asymtpomatic Pain Control:  Epidural Anticipated MOD:  NSVD  Expectant management   Caryl AdaJazma Keondria Siever, DO OB Fellow

## 2017-06-28 NOTE — Progress Notes (Signed)
Patient ID: Tammy Parks, female   DOB: 1996-06-11, 21 y.o.   MRN: [id number]  Comfortable w/ epidural  BP 132/97, other VSS FHR 140s, +accels, no decels Ctx irreg 2-5 mins w/ Pit @ 116mu/min Cx 5/+1 approx 2 hrs ago  IUP@term  PROM, now IOL process  Will continue to increase Pit to achieve active labor Check cx within 1-2 hrs Anticipate SVD  Tammy Parks, [facility] 06/28/2017

## 2017-06-28 NOTE — Progress Notes (Signed)
Labor Progress Note  Tammy Parks is a 21 y.o. G1P0 at 3329w5d  admitted for rupture of membranes  S: Comfortable with epidural.   O:  BP 130/89   Pulse 89   Temp 98.1 F (36.7 C) (Oral)   Resp 18   Ht 5\' 2"  (1.575 m)   Wt 167 lb (75.8 kg)   LMP 09/16/2016 (Exact Date)   SpO2 99%   BMI 30.54 kg/m   No intake/output data recorded.  FHT:  FHR: 135 bpm, variability: moderate,  accelerations:  Present,  decelerations:  Absent UC:   regular, every 2-4 minutes SVE:   Dilation: 5 Effacement (%): 100 Station: +1 Exam by:: Dr Doroteo GlassmanPhelps  SROM   Labs: Lab Results  Component Value Date   WBC 10.9 (H) 06/28/2017   HGB 11.0 (L) 06/28/2017   HCT 34.0 (L) 06/28/2017   MCV 82.5 06/28/2017   PLT 327 06/28/2017    Assessment / Plan: 21 y.o. G1P0 4629w5d in early labor Augmentation of labor, progressing well  Labor: Progressing normally, augmentation with pitocin Fetal Wellbeing:  Category I  Elevated BPs: improved s/p epidural. PIH labs negative, asymtpomatic Pain Control:  Epidural Anticipated MOD:  NSVD  Expectant management   Caryl AdaJazma Phelps, DO OB Fellow

## 2017-06-28 NOTE — Anesthesia Pain Management Evaluation Note (Signed)
  CRNA Pain Management Visit Note  Patient: Tammy Parks, 21 y.o., female  "Hello [facility] am a [facility]. We have an anesthesia team available at all times to provide care throughout the hospital, including epidural management and anesthesia for C-section. I don't know your plan for the delivery whether it a natural birth, water birth, IV sedation, nitrous supplementation, doula or epidural, but we want to meet your pain goals."   1.Was your pain managed to your expectations on prior hospitalizations?   No prior hospitalizations  2.What is your expectation for pain management during this hospitalization?     Epidural  3.How can we help you reach that goal? Epidural in place.  Record the patient's initial score and the patient's pain goal.   Pain: 5--Patient states that this is better than before the epidural. L&D RN Tammy Parks at bedside helping patient manage her epidural PCEA button. I told Tammy Parks to call anesthesia if interventions don't work to continue to increase patient's pain control.  Pain Goal: 5/10 goal; 10/10 prior to epidural placement.  [facility] wants you to be able to say your pain was always managed very well.  Nathaniel Wakeley L 06/28/2017

## 2017-06-28 NOTE — H&P (Signed)
Obstetric History and Physical  Tammy Parks is a 21 y.o. G1P0 with IUP at 1871w5d presenting for SROM at 0815. Patient states she has been having  Regular contractions, minimal vaginal bleeding, ruptured, clear fluid membranes, with active fetal movement.    Has been monitored for elevated BPs without a formal diagnosis. On admission in MAU BPs elevated but not in severe range. Has headache that she rated 6/10 and peripheral edema. No blurry vision and RUQ pain.   Prenatal Course Source of Care: FT with onset of care at 8 weeks Dating: By LMP c/w early US --->  Estimated Date of Delivery: 06/23/17 Pregnancy complications or risks: Patient Active Problem List   Diagnosis Date Noted  . Left ear pain 04/22/2017  . Supervision of normal first pregnancy 11/17/2016  . Scabies 07/27/2016  . Abnormal uterine bleeding 01/15/2015  . Near syncope 08/31/2013  . Scoliosis 03/03/2011   She plans to breastfeed She desires Nexplanon for postpartum contraception.   Sono:    @40w , CWD, normal anatomy, cephalic presentation, posterior placenta, 3462g, 50% EFW  Prenatal labs and studies: ABO, Rh: O/Positive/-- (05/02 1039) Antibody: Negative (09/05 0923) Rubella: 2.31 (05/02 1039) RPR: Non Reactive (09/05 0923)  HBsAg: Negative (05/02 1039)  HIV:   Non-Reactive GBS: Negative 2 hr Glucola  normal Genetic screening normal Anatomy US normal  Prenatal Transfer Tool  Maternal Diabetes: No Genetic Screening: Normal Maternal Ultrasounds/Referrals: Normal Fetal Ultrasounds or other Referrals:  None Maternal Substance Abuse:  No Significant Maternal Medications:  None Significant Maternal Lab Results: Lab values include: Group B Strep negative  Past Medical History:  Diagnosis Date  . Scoliosis     Past Surgical History:  Procedure Laterality Date  . WISDOM TOOTH EXTRACTION      OB History  Gravida Para Term Preterm AB Living  1            SAB TAB Ectopic Multiple Live Births               # Outcome Date GA Lbr Len/2nd Weight Sex Delivery Anes PTL Lv  1 Current               Social History   Socioeconomic History  . Marital status: Married    Spouse name: None  . Number of children: None  . Years of education: None  . Highest education level: None  Social Needs  . Financial resource strain: None  . Food insecurity - worry: None  . Food insecurity - inability: None  . Transportation needs - medical: None  . Transportation needs - non-medical: None  Occupational History  . None  Tobacco Use  . Smoking status: Former Smoker    Packs/day: 1.00    Types: Cigarettes  . Smokeless tobacco: Never Used  . Tobacco comment: Stopped couple of weeks ago  Substance and Sexual Activity  . Alcohol use: No    Alcohol/week: 0.0 oz  . Drug use: No  . Sexual activity: Yes    Birth control/protection: None  Other Topics Concern  . None  Social History Narrative  . None    Family History  Problem Relation Age of Onset  . Cancer Paternal Grandmother   . Thyroid disease Paternal Grandmother   . Breast cancer Maternal Grandmother   . Cancer Maternal Uncle   . ADD / ADHD Brother   . Diabetes Maternal Grandfather   . Thyroid disease Paternal Aunt     Medications Prior to Admission  Medication Sig Dispense  Refill Last Dose  . calcium carbonate (TUMS - DOSED IN MG ELEMENTAL CALCIUM) 500 MG chewable tablet Chew 1 tablet as needed by mouth for indigestion or heartburn.   Taking  . Prenatal Vit-Fe Fumarate-FA (MULTIVITAMIN-PRENATAL) 27-0.8 MG TABS tablet Take 1 tablet by mouth daily at 12 noon.   Taking  . promethazine (PHENERGAN) 25 MG tablet Take 0.5-1 tablets (12.5-25 mg total) by mouth every 6 (six) hours as needed for nausea or vomiting. (Patient not taking: Reported on 06/23/2017) 30 tablet 0 Not Taking  . ranitidine (ZANTAC) 150 MG tablet Take 150 mg as needed by mouth.    Taking    No Known Allergies  Review of Systems: Negative except for what is  mentioned in HPI.  Physical Exam: BP 137/89   Pulse (!) 118   Temp 98 F (36.7 C)   Resp 18   LMP 09/16/2016 (Exact Date)  CONSTITUTIONAL: Well-developed, well-nourished female in no acute distress.  HENT:  Normocephalic, atraumatic, External right and left ear normal. Oropharynx is clear and moist EYES: Conjunctivae and EOM are normal. Pupils are equal, round, and reactive to light. No scleral icterus.  NECK: Normal range of motion, supple, no masses SKIN: Skin is warm and dry. No rash noted. Not diaphoretic. No erythema. No pallor. NEUROLOGIC: Alert and oriented to person, place, and time. Normal reflexes, muscle tone coordination. No cranial nerve deficit noted. PSYCHIATRIC: Normal mood and affect. Normal behavior. Normal judgment and thought content. CARDIOVASCULAR: Normal heart rate noted, regular rhythm RESPIRATORY: Effort and breath sounds normal, no problems with respiration noted ABDOMEN: Soft, nontender, nondistended, gravid. MUSCULOSKELETAL: Normal range of motion. No edema and no tenderness. 2+ distal pulses.  Cervical Exam: Dilation: Fingertip Effacement (%): 70 Station: -2 Presentation: Vertex Exam by:: Viona GilmoreS Moyer    Presentation: cephalic FHT:  Baseline rate 145 bpm   Variability moderate  Accelerations present   Decelerations none Contractions: Every 4-6 mins   Pertinent Labs/Studies:   Results for orders placed or performed during the hospital encounter of 06/28/17 (from the past 24 hour(s))  POCT fern test     Status: Normal   Collection Time: 06/28/17 10:59 AM  Result Value Ref Range   POCT Fern Test Positive = ruptured amniotic membanes   CBC     Status: Abnormal   Collection Time: 06/28/17 11:23 AM  Result Value Ref Range   WBC 10.9 (H) 4.0 - 10.5 K/uL   RBC 4.12 3.87 - 5.11 MIL/uL   Hemoglobin 11.0 (L) 12.0 - 15.0 g/dL   HCT 16.134.0 (L) 09.636.0 - 04.546.0 %   MCV 82.5 78.0 - 100.0 fL   MCH 26.7 26.0 - 34.0 pg   MCHC 32.4 30.0 - 36.0 g/dL   RDW 40.914.1 81.111.5 - 91.415.5  %   Platelets 327 150 - 400 K/uL  Comprehensive metabolic panel     Status: Abnormal   Collection Time: 06/28/17 11:23 AM  Result Value Ref Range   Sodium 136 135 - 145 mmol/L   Potassium 4.0 3.5 - 5.1 mmol/L   Chloride 106 101 - 111 mmol/L   CO2 19 (L) 22 - 32 mmol/L   Glucose, Bld 87 65 - 99 mg/dL   BUN 13 6 - 20 mg/dL   Creatinine, Ser 7.820.71 0.44 - 1.00 mg/dL   Calcium 9.1 8.9 - 95.610.3 mg/dL   Total Protein 5.7 (L) 6.5 - 8.1 g/dL   Albumin 2.7 (L) 3.5 - 5.0 g/dL   AST 21 15 - 41 U/L   ALT  13 (L) 14 - 54 U/L   Alkaline Phosphatase 186 (H) 38 - 126 U/L   Total Bilirubin 0.4 0.3 - 1.2 mg/dL   GFR calc non Af Amer >60 >60 mL/min   GFR calc Af Amer >60 >60 mL/min   Anion gap 11 5 - 15  Type and screen     Status: None   Collection Time: 06/28/17 11:30 AM  Result Value Ref Range   ABO/RH(D) O POS    Antibody Screen NEG    Sample Expiration 07/01/2017     Assessment : Tammy Parks is a 21 y.o. G1P0 at [redacted]w[redacted]d being admitted for SROM.  Plan: Labor: Expectant management. Will start Cytotec for augmenttiona nd cervical ripening.  Elevated BPs: PIH labs ordered, will continue to monitor. Tylenol for headache. No severe features at this time.  Analgesia as needed. FWB: Reassuring fetal heart tracing.   GBS negative Delivery plan: Hopeful for vaginal delivery   Caryl Ada, DO OB Fellow Faculty Practice, [facility] 06/28/2017, 11:13 AM

## 2017-06-28 NOTE — MAU Note (Signed)
Pt presents to MAU with complaints of ROM at 0813.

## 2017-06-28 NOTE — Anesthesia Preprocedure Evaluation (Signed)
Anesthesia Evaluation  Patient identified by MRN, date of birth, ID band Patient awake    Reviewed: Allergy & Precautions, H&P , NPO status , Patient's Chart, lab work & pertinent test results  History of Anesthesia Complications Negative for: history of anesthetic complications  Airway Mallampati: II  TM Distance: >3 FB Neck ROM: full    Dental no notable dental hx. (+) Teeth Intact   Pulmonary neg pulmonary ROS, former smoker,    Pulmonary exam normal breath sounds clear to auscultation       Cardiovascular negative cardio ROS Normal cardiovascular exam Rhythm:regular Rate:Normal     Neuro/Psych negative neurological ROS  negative psych ROS   GI/Hepatic negative GI ROS, Neg liver ROS,   Endo/Other  negative endocrine ROS  Renal/GU negative Renal ROS  negative genitourinary   Musculoskeletal   Abdominal   Peds  Hematology negative hematology ROS (+)   Anesthesia Other Findings Severe scoliosis  Reproductive/Obstetrics (+) Pregnancy                             Anesthesia Physical Anesthesia Plan  ASA: II  Anesthesia Plan: Epidural   Post-op Pain Management:    Induction:   PONV Risk Score and Plan:   Airway Management Planned:   Additional Equipment:   Intra-op Plan:   Post-operative Plan:   Informed Consent: I have reviewed the patients History and Physical, chart, labs and discussed the procedure including the risks, benefits and alternatives for the proposed anesthesia with the patient or authorized representative who has indicated his/her understanding and acceptance.     Plan Discussed with:   Anesthesia Plan Comments:         Anesthesia Quick Evaluation

## 2017-06-28 NOTE — Anesthesia Procedure Notes (Signed)
Epidural Patient location during procedure: OB  Staffing Anesthesiologist: Eytan Carrigan, MD Performed: anesthesiologist   Preanesthetic Checklist Completed: patient identified, site marked, surgical consent, pre-op evaluation, timeout performed, IV checked, risks and benefits discussed and monitors and equipment checked  Epidural Patient position: sitting Prep: DuraPrep Patient monitoring: heart rate, continuous pulse ox and blood pressure Approach: midline Location: L3-L4 Injection technique: LOR saline  Needle:  Needle type: Tuohy  Needle gauge: 17 G Needle length: 9 cm and 9 Needle insertion depth: 5 cm Catheter type: closed end flexible Catheter size: 20 Guage Catheter at skin depth: 9 cm Test dose: negative  Assessment Events: blood not aspirated, injection not painful, no injection resistance, negative IV test and no paresthesia  Additional Notes Patient identified. Risks/Benefits/Options discussed with patient including but not limited to bleeding, infection, nerve damage, paralysis, failed block, incomplete pain control, headache, blood pressure changes, nausea, vomiting, reactions to medication both or allergic, itching and postpartum back pain. Confirmed with bedside nurse the patient's most recent platelet count. Confirmed with patient that they are not currently taking any anticoagulation, have any bleeding history or any family history of bleeding disorders. Patient expressed understanding and wished to proceed. All questions were answered. Sterile technique was used throughout the entire procedure. Please see nursing notes for vital signs. Test dose was given through epidural needle and negative prior to continuing to dose epidural or start infusion. Warning signs of high block given to the patient including shortness of breath, tingling/numbness in hands, complete motor block, or any concerning symptoms with instructions to call for help. Patient was given  instructions on fall risk and not to get out of bed. All questions and concerns addressed with instructions to call with any issues.     

## 2017-06-29 ENCOUNTER — Encounter (HOSPITAL_COMMUNITY): Payer: Self-pay | Admitting: *Deleted

## 2017-06-29 DIAGNOSIS — Z3A40 40 weeks gestation of pregnancy: Secondary | ICD-10-CM

## 2017-06-29 DIAGNOSIS — O480 Post-term pregnancy: Secondary | ICD-10-CM

## 2017-06-29 LAB — CBC
HCT: 33.6 % — ABNORMAL LOW (ref 36.0–46.0)
Hemoglobin: 10.9 g/dL — ABNORMAL LOW (ref 12.0–15.0)
MCH: 26.6 pg (ref 26.0–34.0)
MCHC: 32.4 g/dL (ref 30.0–36.0)
MCV: 82 fL (ref 78.0–100.0)
PLATELETS: 303 10*3/uL (ref 150–400)
RBC: 4.1 MIL/uL (ref 3.87–5.11)
RDW: 14.2 % (ref 11.5–15.5)
WBC: 21.5 10*3/uL — AB (ref 4.0–10.5)

## 2017-06-29 LAB — RPR: RPR: NONREACTIVE

## 2017-06-29 MED ORDER — ACETAMINOPHEN 325 MG PO TABS: 650.0000 mg | ORAL_TABLET | ORAL | Status: DC | PRN

## 2017-06-29 MED ORDER — IBUPROFEN 600 MG PO TABS
600.0000 mg | ORAL_TABLET | Freq: Four times a day (QID) | ORAL | Status: DC
  Administered 2017-06-29 – 2017-06-30 (×4): 600 mg via ORAL
  Filled 2017-06-29 (×4): qty 1

## 2017-06-29 MED ORDER — PRENATAL MULTIVITAMIN CH
1.0000 | ORAL_TABLET | Freq: Every day | ORAL | Status: DC
  Administered 2017-06-30: 1 via ORAL
  Filled 2017-06-29: qty 1

## 2017-06-29 MED ORDER — DIBUCAINE 1 % RE OINT: 1.0000 "application " | TOPICAL_OINTMENT | RECTAL | Status: DC | PRN

## 2017-06-29 MED ORDER — ONDANSETRON HCL 4 MG PO TABS: 4.0000 mg | ORAL_TABLET | ORAL | Status: DC | PRN

## 2017-06-29 MED ORDER — SENNOSIDES-DOCUSATE SODIUM 8.6-50 MG PO TABS
2.0000 | ORAL_TABLET | ORAL | Status: DC
  Administered 2017-06-29: 2 via ORAL
  Filled 2017-06-29: qty 2

## 2017-06-29 MED ORDER — WITCH HAZEL-GLYCERIN EX PADS: 1.0000 "application " | MEDICATED_PAD | CUTANEOUS | Status: DC | PRN

## 2017-06-29 MED ORDER — DIPHENHYDRAMINE HCL 25 MG PO CAPS: 25.0000 mg | ORAL_CAPSULE | Freq: Four times a day (QID) | ORAL | Status: DC | PRN

## 2017-06-29 MED ORDER — TETANUS-DIPHTH-ACELL PERTUSSIS 5-2.5-18.5 LF-MCG/0.5 IM SUSP: 0.5000 mL | Freq: Once | INTRAMUSCULAR | Status: DC

## 2017-06-29 MED ORDER — OXYCODONE-ACETAMINOPHEN 5-325 MG PO TABS: 2.0000 | ORAL_TABLET | ORAL | Status: DC | PRN

## 2017-06-29 MED ORDER — BENZOCAINE-MENTHOL 20-0.5 % EX AERO
1.0000 "application " | INHALATION_SPRAY | CUTANEOUS | Status: DC | PRN
  Administered 2017-06-29: 1 via TOPICAL
  Filled 2017-06-29: qty 56

## 2017-06-29 MED ORDER — COCONUT OIL OIL: 1.0000 "application " | TOPICAL_OIL | Status: DC | PRN

## 2017-06-29 MED ORDER — ONDANSETRON HCL 4 MG/2ML IJ SOLN
4.0000 mg | INTRAMUSCULAR | Status: DC | PRN
Start: 2017-06-29 — End: 2017-06-30

## 2017-06-29 MED ORDER — OXYCODONE-ACETAMINOPHEN 5-325 MG PO TABS: 1.0000 | ORAL_TABLET | ORAL | Status: DC | PRN

## 2017-06-29 MED ORDER — SIMETHICONE 80 MG PO CHEW: 80.0000 mg | CHEWABLE_TABLET | ORAL | Status: DC | PRN

## 2017-06-29 MED ORDER — ZOLPIDEM TARTRATE 5 MG PO TABS: 5.0000 mg | ORAL_TABLET | Freq: Every evening | ORAL | Status: DC | PRN

## 2017-06-29 NOTE — Progress Notes (Signed)
Patient ID: [name], female   DOB: January 29, 1996, 21 y.o.   MRN: [id number]  Pushing about 1.5hrs now- doing well. Vtx beginning to become visible w/ pushing now. FHR stable, VSS. Just moved to side-lying; will continue to push.  Cam HaiSHAW, Shreyas Piatkowski 06/29/2017

## 2017-06-29 NOTE — Progress Notes (Signed)
Patient ID: [name], female   DOB: Jun 13, 1996, [age] y.o.   MRN: [id number]  Pushing x 20 mins- doing well  T 99.5, other VSS FHR 150s, +accels, occ mi variables Ctx q 2-3 mins Vtx +2  IUP@term  End 1st stage  Continue pushing in various positions Anticipate SVD  Cam HaiSHAW, Josie Mesa CNM 06/29/2017

## 2017-06-29 NOTE — Anesthesia Postprocedure Evaluation (Signed)
Anesthesia Post Note  Patient: Tammy PaulsSamantha Allison Laurie  Procedure(s) Performed: AN AD HOC LABOR EPIDURAL     Patient location during evaluation: Mother Baby Anesthesia Type: Epidural Level of consciousness: awake and alert and oriented Pain management: satisfactory to patient Vital Signs Assessment: post-procedure vital signs reviewed and stable Respiratory status: spontaneous breathing and nonlabored ventilation Cardiovascular status: stable Postop Assessment: no headache, no backache, no signs of nausea or vomiting, adequate PO intake and patient able to bend at knees (patient up walking) Anesthetic complications: no    Last Vitals:  Vitals:   06/29/17 1115 06/29/17 1215  BP: 136/66 116/67  Pulse: 69 77  Resp: 18 18  Temp: 36.6 C 36.4 C  SpO2:      Last Pain:  Vitals:   06/29/17 1215  TempSrc: Oral  PainSc: 7    Pain Goal: Patients Stated Pain Goal: 4 (06/28/17 1214)               Madison HickmanGREGORY,Adelaido Nicklaus

## 2017-06-29 NOTE — Progress Notes (Signed)
Patient ID: Lillia PaulsSamantha Allison Bensch, female   DOB: February 18, 1996, 21 y.o.   MRN: [id number]  Pushed from about 0110-0210 without much descent; now laboring down and will begin pushing soon. FHR 145, some 10x10accels, occ variables. Ctx q 2-5 min with Pit at 2710mu/mi and being increased.  Cam HaiSHAW, Tytiana Coles CNM 06/29/2017 3:06 AM

## 2017-06-30 DIAGNOSIS — Z8759 Personal history of other complications of pregnancy, childbirth and the puerperium: Secondary | ICD-10-CM

## 2017-06-30 MED ORDER — OXYCODONE-ACETAMINOPHEN 5-325 MG PO TABS: 1.0000 | ORAL_TABLET | Freq: Four times a day (QID) | ORAL | 0 refills | Status: DC | PRN

## 2017-06-30 MED ORDER — SENNOSIDES-DOCUSATE SODIUM 8.6-50 MG PO TABS: 2.0000 | ORAL_TABLET | Freq: Every day | ORAL | 1 refills | Status: DC

## 2017-06-30 MED ORDER — IBUPROFEN 600 MG PO TABS: 600.0000 mg | ORAL_TABLET | Freq: Four times a day (QID) | ORAL | 0 refills | Status: DC

## 2017-06-30 NOTE — Discharge Summary (Signed)
OB Discharge Summary     Patient Name: Tammy Parks DOB: [birth year] MRN: [id number]  Date of admission: 06/28/2017 Delivering MD: Shonna ChockWOUK, NOAH BEDFORD   Date of discharge: 06/30/2017  Admitting diagnosis: WATER BROKE Intrauterine pregnancy: 5328w6d     Secondary diagnosis:  Active Problems:   Post-dates pregnancy   Status post vacuum-assisted vaginal delivery  Additional problems: Elevated BPs intrapartum     Discharge diagnosis: Term Pregnancy Delivered                                                                                                Post partum procedures:None  Augmentation: Pitocin  Complications: None  Hospital course:  Onset of Labor With Vaginal Delivery     21 y.o. yo G1P1001 at 2928w6d was admitted in Latent Labor on 06/28/2017. Patient had an uncomplicated labor course as follows:  Membrane Rupture Time/Date: 8:13 AM ,06/28/2017   Intrapartum Procedures: Episiotomy: None [1]                                         Lacerations:  1st degree [2]  Patient had a delivery of a Viable infant. 06/29/2017  Information for the patient's newborn:  Tammy Parks, Tammy Parks [[id number]][[id number]]  Delivery Method: Vaginal, Vacuum (Extractor)(Filed from Delivery Summary)    Pateint had an uncomplicated postpartum course.  She is ambulating, tolerating a regular diet, passing flatus, and urinating well. Patient is discharged home in stable condition on 06/30/17.  BPs remained stable.    Physical exam  Vitals:   06/29/17 1031 06/29/17 1115 06/29/17 1215 06/29/17 1615  BP: (!) 127/59 136/66 116/67 129/88  Pulse: 71 69 77 (!) 53  Resp:  18 18 18   Temp:  97.9 F (36.6 C) 97.6 F (36.4 C) (!) 97.4 F (36.3 C)  TempSrc:  Oral Oral Oral  SpO2:      Weight:      Height:       General: alert, cooperative and no distress Lochia: appropriate Uterine Fundus: firm Incision: N/A DVT Evaluation: No evidence of DVT seen on physical exam. Labs: Lab Results  Component Value  Date   WBC 21.5 (H) 06/29/2017   HGB 10.9 (L) 06/29/2017   HCT 33.6 (L) 06/29/2017   MCV 82.0 06/29/2017   PLT 303 06/29/2017   CMP Latest Ref Rng & Units 06/28/2017  Glucose 65 - 99 mg/dL 87  BUN 6 - 20 mg/dL 13  Creatinine 7.820.44 - 9.561.00 mg/dL 2.130.71  Sodium 086135 - 578145 mmol/L 136  Potassium 3.5 - 5.1 mmol/L 4.0  Chloride 101 - 111 mmol/L 106  CO2 22 - 32 mmol/L 19(L)  Calcium 8.9 - 10.3 mg/dL 9.1  Total Protein 6.5 - 8.1 g/dL 4.6(N5.7(L)  Total Bilirubin 0.3 - 1.2 mg/dL 0.4  Alkaline Phos 38 - 126 U/L 186(H)  AST 15 - 41 U/L 21  ALT 14 - 54 U/L 13(L)    Discharge instruction: per After Visit Summary and "Baby and Me Booklet".  After visit meds:  Allergies as of 06/30/2017   No Known Allergies     Medication List    STOP taking these medications   promethazine 25 MG tablet Commonly known as:  PHENERGAN     TAKE these medications   acetaminophen 325 MG tablet Commonly known as:  TYLENOL Take 325 mg by mouth every 6 (six) hours as needed for mild pain or headache.   calcium carbonate 500 MG chewable tablet Commonly known as:  TUMS - dosed in mg elemental calcium Chew 1 tablet as needed by mouth for indigestion or heartburn.   ibuprofen 600 MG tablet Commonly known as:  ADVIL,MOTRIN Take 1 tablet (600 mg total) by mouth every 6 (six) hours.   multivitamin-prenatal 27-0.8 MG Tabs tablet Take 1 tablet by mouth daily at 12 noon.   oxyCODONE-acetaminophen 5-325 MG tablet Commonly known as:  PERCOCET/ROXICET Take 1 tablet by mouth every 6 (six) hours as needed for severe pain.   ranitidine 150 MG tablet Commonly known as:  ZANTAC Take 150 mg as needed by mouth.   senna-docusate 8.6-50 MG tablet Commonly known as:  Senokot-S Take 2 tablets by mouth at bedtime.       Diet: routine diet  Activity: Advance as tolerated. Pelvic rest for 6 weeks.   Outpatient follow up:4 weeks Follow up Appt: Future Appointments  Date Time Provider Department Center  07/07/2017   2:45 PM Jacklyn ShellCresenzo-Dishmon, Frances, CNM [facility]  07/28/2017  2:45 PM Cresenzo-Dishmon, Scarlette CalicoFrances, CNM [facility]   Follow up Visit:No Follow-up on file.  Postpartum contraception: Nexplanon  Newborn Data: Live born female  Birth Weight: 7 lb 2.5 oz (3245 g) APGAR: 7, 9  Newborn Delivery   Birth date/time:  06/29/2017 09:19:00 Delivery type:  Vaginal, Vacuum (Extractor)     Baby Feeding: Breast Disposition:home with mother   06/30/2017 Tammy AdaJazma Aften Lipsey, DO

## 2017-06-30 NOTE — Progress Notes (Signed)
POSTPARTUM PROGRESS NOTE  Post Partum Day 1 Subjective:  Tammy Parks is a 21 y.o. G1P1001 3366w6d s/p VAVD.  No acute events overnight.  Pt denies problems with ambulating, voiding or po intake.  She denies nausea or vomiting.  Pain is well controlled.  She has had flatus. She has had bowel movement.  Lochia Small.   Objective: Blood pressure 129/88, pulse (!) 53, temperature (!) 97.4 F (36.3 C), temperature source Oral, resp. rate 18, height 5\' 2"  (1.575 m), weight 75.8 kg (167 lb), last menstrual period 09/16/2016, SpO2 99 %, unknown if currently breastfeeding.  Physical Exam:  General: alert, cooperative and no distress Lochia:normal flow Chest: no respiratory distress Heart:regular rate, distal pulses intact Abdomen: soft, nontender,  Uterine Fundus: firm, appropriately tender DVT Evaluation: No calf swelling or tenderness Extremities: small edema  Recent Labs    06/28/17 1123 06/29/17 1037  HGB 11.0* 10.9*  HCT 34.0* 33.6*    Assessment/Plan:  ASSESSMENT: Tammy Parks is a 21 y.o. G1P1001 2466w6d s/p VAVD. Breastfeeding well  She has a good support system at home and would like to be discharged today.   #Gestational hypertension: BP currently well-controlled, systolics in 120s since delivery.  Follow-up in one week.    LOS: 2 days   Andreas BlowerChristopher TrennepohlDO 06/30/2017, 11:18 AM

## 2017-06-30 NOTE — Discharge Instructions (Signed)

## 2017-07-02 ENCOUNTER — Encounter (HOSPITAL_COMMUNITY): Payer: 59

## 2017-07-06 ENCOUNTER — Telehealth: Payer: Self-pay | Admitting: *Deleted

## 2017-07-06 NOTE — Telephone Encounter (Signed)
Patient called concerned about her bleeding. States she passed a clot about the size of a golf ball last night but her bleeding has been fine. She is not saturating a pad over 2 hours. She has been breastfeeding on and off and does feel more bleeding then. Informed patient that the uterus does contract some when she breastfeeds and may feel some gushes of blood during that time. Encouraged patient to empty bladder frequently and to continue breastfeeding to help uterus contract down but if she starts saturating more than a pad 30-1 hour, then she needed to let us know. Pt has appointment tomorrow, so we can evaluate then. Verbalized understanding.

## 2017-07-07 ENCOUNTER — Ambulatory Visit (INDEPENDENT_AMBULATORY_CARE_PROVIDER_SITE_OTHER): Payer: 59 | Admitting: Advanced Practice Midwife

## 2017-07-07 ENCOUNTER — Encounter: Payer: Self-pay | Admitting: Advanced Practice Midwife

## 2017-07-07 VITALS — BP 128/80 | HR 97 | Wt 138.0 lb

## 2017-07-07 DIAGNOSIS — N939 Abnormal uterine and vaginal bleeding, unspecified: Secondary | ICD-10-CM | POA: Diagnosis not present

## 2017-07-07 DIAGNOSIS — O165 Unspecified maternal hypertension, complicating the puerperium: Secondary | ICD-10-CM

## 2017-07-07 LAB — POCT HEMOGLOBIN: Hemoglobin: 12.8 g/dL (ref 12.2–16.2)

## 2017-07-07 NOTE — Addendum Note (Signed)
Addended by: Federico FlakeNES, Tamarah Bhullar A on: 07/07/2017 03:23 PM   Modules accepted: Orders

## 2017-07-07 NOTE — Progress Notes (Signed)
[facility] Visit  Patient name: Tammy PaulsSamantha Allison Cancelliere MRN [id number]  Date of birth: [birth year]  CC & HPI:  Tammy Parks is a 21 y.o.  female presenting today for BP check. She is 1 week SP VAD, had eleveated BPs in labor, here for a f/u B p check.   Vitals:   07/07/17 1442  BP: 128/80  Pulse: 97   Says sometimes "feels blood gushing out".  Breast and bottle feeding.  Hgb 12.                                                 `````````````````````````````````````````````````````````````````````````````````````````````  Pertinent History Reviewed:  Medical & Surgical Hx:   Past Medical History:  Diagnosis Date  . Scoliosis    Past Surgical History:  Procedure Laterality Date  . DENTAL SURGERY     pt sates over the years  . WISDOM TOOTH EXTRACTION     Family History  Problem Relation Age of Onset  . Cancer Paternal Grandmother   . Thyroid disease Paternal Grandmother   . Breast cancer Maternal Grandmother   . Cancer Maternal Uncle   . ADD / ADHD Brother   . Diabetes Maternal Grandfather   . Thyroid disease Paternal Aunt     Current Outpatient Medications:  .  Prenatal Vit-Fe Fumarate-FA (MULTIVITAMIN-PRENATAL) 27-0.8 MG TABS tablet, Take 1 tablet by mouth daily at 12 noon., Disp: , Rfl:  .  acetaminophen (TYLENOL) 325 MG tablet, Take 325 mg by mouth every 6 (six) hours as needed for mild pain or headache., Disp: , Rfl:  .  calcium carbonate (TUMS - DOSED IN MG ELEMENTAL CALCIUM) 500 MG chewable tablet, Chew 1 tablet as needed by mouth for indigestion or heartburn., Disp: , Rfl:  .  ibuprofen (ADVIL,MOTRIN) 600 MG tablet, Take 1 tablet (600 mg total) by mouth every 6 (six) hours. (Patient not taking: Reported on 07/07/2017), Disp: 30 tablet, Rfl: 0 .  oxyCODONE-acetaminophen (PERCOCET/ROXICET) 5-325 MG tablet, Take 1 tablet by mouth every 6 (six) hours as needed for severe pain. (Patient not taking: Reported on 07/07/2017), Disp: 20 tablet, Rfl: 0 .   ranitidine (ZANTAC) 150 MG tablet, Take 150 mg as needed by mouth. , Disp: , Rfl:  .  senna-docusate (SENOKOT-S) 8.6-50 MG tablet, Take 2 tablets by mouth at bedtime. (Patient not taking: Reported on 07/07/2017), Disp: 30 tablet, Rfl: 1 Social History: Reviewed -  reports that she has quit smoking. Her smoking use included cigarettes. She smoked 1.00 pack per day. she has never used smokeless tobacco.  Review of Systems:   Constitutional: Negative for fever and chills Eyes: Negative for visual disturbances Respiratory: Negative for shortness of breath, dyspnea Cardiovascular: Negative for chest pain or palpitations  Gastrointestinal: Negative for vomiting, diarrhea and constipation; no abdominal pain Genitourinary: Negative for dysuria and urgency, vaginal irritation or itching Musculoskeletal: Negative for back pain, joint pain, myalgias  Neurological: Negative for dizziness and headaches    Objective Findings:    Physical Examination: General appearance - well appearing, and in no distress Mental status - alert, oriented to person, place, and time Chest:  Normal respiratory effort Heart - normal rate and regular rhythm Abdomen:  Soft, nontender Pelvic: deferred Musculoskeletal:  Normal range of motion without pain Extremities:  No edema    No results found for this or any previous visit (from  the past 24 hour(s)).    Assessment & Plan:  A:   BPs in labor that were high, normal now P:     Return for As scheduled. Nexplanon ordred  CRESENZO-DISHMAN,Owenn Rothermel CNM 07/07/2017 3:15 PM

## 2017-07-28 ENCOUNTER — Other Ambulatory Visit: Payer: Self-pay

## 2017-07-28 ENCOUNTER — Encounter: Payer: Self-pay | Admitting: Advanced Practice Midwife

## 2017-07-28 ENCOUNTER — Ambulatory Visit (INDEPENDENT_AMBULATORY_CARE_PROVIDER_SITE_OTHER): Payer: No Typology Code available for payment source | Admitting: Advanced Practice Midwife

## 2017-07-28 NOTE — Progress Notes (Signed)
Tammy Parks is a 22 y.o. who presents for a postpartum visit. She is 4 weeks postpartum following a VAD. I have fully reviewed the prenatal and intrapartum course. The delivery was at 40.6 gestational weeks.  Anesthesia: epidural. She had some eleveated BPs in labor, none since. Postpartum course has been uneventful. Baby's course has been uneventful. Baby is feeding by breast. Bleeding: staining only. Bowel function is normal. Bladder function is normal. Patient is not sexually active. Contraception method is abstinence. Postpartum depression screening: negative. Vitals:   07/28/17 1455  BP: 98/60  Pulse: 63     Current Outpatient Medications:  .  ibuprofen (ADVIL,MOTRIN) 600 MG tablet, Take 1 tablet (600 mg total) by mouth every 6 (six) hours., Disp: 30 tablet, Rfl: 0 .  Prenatal Vit-Fe Fumarate-FA (MULTIVITAMIN-PRENATAL) 27-0.8 MG TABS tablet, Take 1 tablet by mouth daily at 12 noon., Disp: , Rfl:   Review of Systems   Constitutional: Negative for fever and chills Eyes: Negative for visual disturbances Respiratory: Negative for shortness of breath, dyspnea Cardiovascular: Negative for chest pain or palpitations  Gastrointestinal: Negative for vomiting, diarrhea and constipation Genitourinary: Negative for dysuria and urgency Musculoskeletal: Negative for back pain, joint pain, myalgias  Neurological: Negative for dizziness and headaches    Objective:     Vitals:   07/28/17 1455  BP: 98/60  Pulse: 63   General:  alert, cooperative and no distress   Breasts:  negative  Lungs: clear to auscultation bilaterally  Heart:  regular rate and rhythm  Abdomen: Soft, nontender   Vulva:  normal  Vagina: normal vagina  Cervix:  closed  Corpus: Well involuted     Rectal Exam: no hemorrhoids        Assessment:    normal postpartum exam.  Plan:   1. Contraception: Nexplanon 2. Follow up in:  tomorrow for Nexplanon or as needed.

## 2017-07-29 ENCOUNTER — Ambulatory Visit (INDEPENDENT_AMBULATORY_CARE_PROVIDER_SITE_OTHER): Payer: No Typology Code available for payment source | Admitting: Women's Health

## 2017-07-29 ENCOUNTER — Encounter: Payer: Self-pay | Admitting: Women's Health

## 2017-07-29 VITALS — BP 100/68 | HR 86 | Ht 62.0 in | Wt 132.0 lb

## 2017-07-29 DIAGNOSIS — Z3202 Encounter for pregnancy test, result negative: Secondary | ICD-10-CM

## 2017-07-29 DIAGNOSIS — Z3046 Encounter for surveillance of implantable subdermal contraceptive: Secondary | ICD-10-CM

## 2017-07-29 DIAGNOSIS — R300 Dysuria: Secondary | ICD-10-CM

## 2017-07-29 DIAGNOSIS — Z30017 Encounter for initial prescription of implantable subdermal contraceptive: Secondary | ICD-10-CM | POA: Insufficient documentation

## 2017-07-29 LAB — POCT URINE PREGNANCY: PREG TEST UR: NEGATIVE

## 2017-07-29 MED ORDER — ETONOGESTREL 68 MG ~~LOC~~ IMPL
68.0000 mg | DRUG_IMPLANT | Freq: Once | SUBCUTANEOUS | Status: AC
  Administered 2017-07-29: 68 mg via SUBCUTANEOUS

## 2017-07-29 NOTE — Patient Instructions (Signed)

## 2017-07-29 NOTE — Progress Notes (Signed)
   NEXPLANON INSERTION Patient name: Tammy PaulsSamantha Allison Lenderman MRN [id number]  Date of birth: 08-Jan-1996 Subjective Findings:   Tammy Parks is a 22 y.o. 791P1001 Caucasian female being seen today for insertion of a Nexplanon.   No LMP recorded. Last sexual intercourse was prior to birth of baby Last pap5/2/18. Results were:  normal  Risks/benefits/side effects of Nexplanon have been discussed and her questions have been answered.  Specifically, a failure rate of 07/998 has been reported, with an increased failure rate if pt takes St. John's Wort and/or antiseizure medicaitons.  She is aware of the common side effect of irregular bleeding, which the incidence of decreases over time. Signed copy of informed consent in chart.  Pertinent History Reviewed:   Reviewed past medical,surgical, social, obstetrical and family history.  Reviewed problem list, medications and allergies. Objective Findings & Procedure:    Vitals:   07/29/17 1110  BP: 100/68  Pulse: 86  Weight: 132 lb (59.9 kg)  Height: 5\' 2"  (1.575 m)  Body mass index is 24.14 kg/m.  Results for orders placed or performed in visit on 07/29/17 (from the past 24 hour(s))  POCT urine pregnancy   Collection Time: 07/29/17 11:16 AM  Result Value Ref Range   Preg Test, Ur Negative Negative     Time out was performed.  She is left-handed, so her right arm, approximately 10cm from the medial epicondyle and 3-5cm posterior to the sulcus, was cleansed with alcohol and anesthetized with 2cc of 2% Lidocaine.  The area was cleansed again with betadine and the Nexplanon was inserted per manufacturer's recommendations without difficulty.  3 steri-strips and pressure bandage were applied. The patient tolerated the procedure well.  Assessment & Plan:   1) Nexplanon insertion Pt was instructed to keep the area clean and dry, remove pressure bandage in 24 hours, and keep insertion site covered with the steri-strip for 3-5 days.  Back up  contraception was recommended for 2 weeks.  She was given a card indicating date Nexplanon was inserted and date it needs to be removed. Follow-up PRN problems.  Orders Placed This Encounter  Procedures  . Urine Culture  . POCT urine pregnancy    Follow-up: Return in about 4 months (around 11/26/2017) for Physical.  Marge DuncansBooker, Haidar Muse Randall CNM, [facility]NP-BC 07/29/2017 11:34 AM

## 2017-07-29 NOTE — Addendum Note (Signed)
Addended by: Tish FredericksonLANCASTER, Boyce Keltner A on: 07/29/2017 12:12 PM   Modules accepted: Orders

## 2017-08-01 ENCOUNTER — Encounter: Payer: Self-pay | Admitting: *Deleted

## 2017-08-01 ENCOUNTER — Other Ambulatory Visit: Payer: Self-pay | Admitting: Women's Health

## 2017-08-01 ENCOUNTER — Telehealth: Payer: Self-pay | Admitting: *Deleted

## 2017-08-01 LAB — URINE CULTURE

## 2017-08-01 MED ORDER — PENICILLIN V POTASSIUM 500 MG PO TABS: 500.0000 mg | ORAL_TABLET | Freq: Four times a day (QID) | ORAL | 0 refills | Status: DC

## 2017-08-01 NOTE — Telephone Encounter (Signed)
Called patient but mailbox full. Will send mychart message.

## 2017-08-30 ENCOUNTER — Telehealth: Payer: Self-pay | Admitting: *Deleted

## 2017-08-30 NOTE — Telephone Encounter (Signed)
LMOVM returning patient's call.  

## 2017-09-02 NOTE — Telephone Encounter (Signed)
LMOVM returning patient's call. Advised to call back if she still had issues.

## 2017-09-13 ENCOUNTER — Encounter: Payer: Self-pay | Admitting: Student

## 2017-09-13 ENCOUNTER — Other Ambulatory Visit: Payer: Self-pay

## 2017-09-13 ENCOUNTER — Ambulatory Visit (INDEPENDENT_AMBULATORY_CARE_PROVIDER_SITE_OTHER): Payer: No Typology Code available for payment source | Admitting: Student

## 2017-09-13 VITALS — BP 112/64 | HR 92 | Temp 98.5°F | Wt 130.0 lb

## 2017-09-13 DIAGNOSIS — R69 Illness, unspecified: Secondary | ICD-10-CM | POA: Diagnosis not present

## 2017-09-13 DIAGNOSIS — J111 Influenza due to unidentified influenza virus with other respiratory manifestations: Secondary | ICD-10-CM

## 2017-09-13 MED ORDER — OSELTAMIVIR PHOSPHATE 75 MG PO CAPS
75.0000 mg | ORAL_CAPSULE | Freq: Two times a day (BID) | ORAL | 0 refills | Status: DC
Start: 2017-09-13 — End: 2017-11-09

## 2017-09-13 NOTE — Patient Instructions (Signed)
It appears that you have a viral upper respiratory infection which could be flu.  We sent a prescription for Tamiflu to your pharmacy.  Symptoms typically peak at 3-4 days of illness and then gradually improve over 10-14 days. However, a cough may last 3-5 weeks.   - You can try Afrin nasal spray for nasal congestion.  This is available over-the-counter.  Please do not use this for more than 3 days. - A tablespoonful of honey before bedtime is helpful for cough - Get plenty of rest and adequate hydration. - Consume warm fluids (soup or tea). It relieves stuffy nose, and to loosen phlegm. - Can try saline nasal spray or a Neti Pot for stuffy nose  CONTACT YOUR DOCTOR IF YOU EXPERIENCE ANY OF THE FOLLOWING: - High fever, chest pain, shortness of breath or  not able to keep down food or fluids.  - Cough that gets worse while other cold symptoms improve - Flare up of any chronic lung problem, such as asthma - Your symptoms persist longer than 2 weeks   I recommend taking your daughter to the pediatrician for evaluation.

## 2017-09-13 NOTE — Progress Notes (Signed)
  Subjective:    Tammy Parks is a 22 y.o. old female here for cough, sore throat and myalgia.  HPI Cough, sore throat and myalgia. This has been going on for one day. Gotten worse last night. She is sneezing a lot as well. Cough is productive with greenish phlegm.  Denies hemoptysis.  She has a ten week old child who had cough and sneezing for one week. She is not running fever. Denies fever, nausea, vomiting or diarrhea. Denies shortness of breath, chest pain. She had her flu vaccine this season. No history of asthma or allergy.  Niece with cold like symptoms about a week ago a day after they were together. LMP 2 weeks ago.  She reports using Nexplanon. PMH/Problem List: has Scoliosis; Near syncope; Abnormal uterine bleeding; Scabies; Left ear pain; and Nexplanon insertion on their problem list.   has a past medical history of Scoliosis.  FH:  Family History  Problem Relation Age of Onset  . Cancer Paternal Grandmother   . Thyroid disease Paternal Grandmother   . Breast cancer Maternal Grandmother   . Cancer Maternal Uncle   . ADD / ADHD Brother   . Diabetes Maternal Grandfather   . Thyroid disease Paternal Aunt     SH Social History   Tobacco Use  . Smoking status: Former Smoker    Packs/day: 1.00    Types: Cigarettes  . Smokeless tobacco: Never Used  . Tobacco comment: Stopped couple of weeks ago  Substance Use Topics  . Alcohol use: No    Alcohol/week: 0.0 oz  . Drug use: No    Review of Systems Review of systems negative except for pertinent positives and negatives in history of present illness above.     Objective:     Vitals:   09/13/17 1043  BP: 112/64  Pulse: 92  Temp: 98.5 F (36.9 C)  TempSrc: Oral  SpO2: 99%  Weight: 130 lb (59 kg)   Body mass index is 23.78 kg/m.  Physical Exam  GEN: appears well, no apparent distress. Eyes: conjunctiva without injection, sclera anicteric Nares: Some rhinorrhea, right inferior turbinate swelling Oropharynx:  mmm, oropharyngeal erythema without exudation, petechiae or uvular deviation. HEM: negative for cervical or periauricular lymphadenopathies CVS: RRR, nl s1 & s2, no murmurs, no edema RESP: no IWOB, good air movement bilaterally, CTAB GI: BS present & normal, soft, NTND, no HSM SKIN: no apparent skin lesion ENDO: negative thyromegally NEURO: alert and oiented appropriately, no gross deficits  PSYCH: euthymic mood with congruent affect    Assessment and Plan:  1. Influenza-like illness: Patient with runny nose, sneezing, sore throat, cough and myalgia concerning for flu.  She have symptoms for 1 day.  She has a 9310-week-old child at home.  Give Tamiflu.  Discussed about other conservative management including Afrin for 3 days, adequate hydration and a tablespoonful of honey before bedtime. Unlikely strep pharyngitis. She has no respiratory distress. Lung exam normal. Recommended taking her daughter to a pediatrician.  -Recommended conservative management including rest and adequate hydration -Discussed return precautions including but not limited to shortness of breath or increased working of breathing, severe persistent cough, persistent fever over 101F, not tolerating fluids by mouth or other symptoms concerning to her  Return if symptoms worsen or fail to improve.  Almon Herculesaye T Asanti Craigo, MD 09/13/17 Pager: [phone]

## 2017-11-09 ENCOUNTER — Ambulatory Visit: Payer: No Typology Code available for payment source | Admitting: Advanced Practice Midwife

## 2017-11-09 ENCOUNTER — Encounter: Payer: Self-pay | Admitting: Advanced Practice Midwife

## 2017-11-09 VITALS — BP 110/74 | HR 103 | Ht 62.0 in | Wt 126.0 lb

## 2017-11-09 DIAGNOSIS — R300 Dysuria: Secondary | ICD-10-CM | POA: Insufficient documentation

## 2017-11-09 DIAGNOSIS — Z1389 Encounter for screening for other disorder: Secondary | ICD-10-CM

## 2017-11-09 DIAGNOSIS — N390 Urinary tract infection, site not specified: Secondary | ICD-10-CM | POA: Diagnosis not present

## 2017-11-09 LAB — POCT URINALYSIS DIPSTICK
Glucose, UA: NEGATIVE
Ketones, UA: NEGATIVE
Leukocytes, UA: NEGATIVE
NITRITE UA: NEGATIVE

## 2017-11-09 MED ORDER — PHENAZOPYRIDINE HCL 200 MG PO TABS: 200.0000 mg | ORAL_TABLET | Freq: Three times a day (TID) | ORAL | 0 refills | Status: DC | PRN

## 2017-11-09 MED ORDER — NITROFURANTOIN MONOHYD MACRO 100 MG PO CAPS: 100.0000 mg | ORAL_CAPSULE | Freq: Two times a day (BID) | ORAL | 0 refills | Status: DC

## 2017-11-09 NOTE — Addendum Note (Signed)
Addended by: Moss McRESENZO, LATISHA M on: 11/09/2017 12:31 PM   Modules accepted: Orders

## 2017-11-09 NOTE — Progress Notes (Signed)
[facility] Visit  Patient name: Tammy PaulsSamantha Allison Parks MRN [id number]  Date of birth: 09/21/1995  CC & HPI:  Tammy Parks is a 22 y.o. Caucasian female presenting today for Dysuria, frequency Was tx for culture + UTI 2 months ago  The odor went away but not the other sx.   Pertinent History Reviewed:  Medical & Surgical Hx:   Past Medical History:  Diagnosis Date  . Scoliosis    Past Surgical History:  Procedure Laterality Date  . DENTAL SURGERY     pt sates over the years  . WISDOM TOOTH EXTRACTION     Family History  Problem Relation Age of Onset  . Cancer Paternal Grandmother   . Thyroid disease Paternal Grandmother   . Breast cancer Maternal Grandmother   . Cancer Maternal Uncle   . ADD / ADHD Brother   . Diabetes Maternal Grandfather   . Thyroid disease Paternal Aunt     Current Outpatient Medications:  .  ibuprofen (ADVIL,MOTRIN) 600 MG tablet, Take 1 tablet (600 mg total) by mouth every 6 (six) hours. (Patient not taking: Reported on 11/09/2017), Disp: 30 tablet, Rfl: 0 .  nitrofurantoin, macrocrystal-monohydrate, (MACROBID) 100 MG capsule, Take 1 capsule (100 mg total) by mouth 2 (two) times daily., Disp: 14 capsule, Rfl: 0 .  phenazopyridine (PYRIDIUM) 200 MG tablet, Take 1 tablet (200 mg total) by mouth 3 (three) times daily as needed for pain., Disp: 10 tablet, Rfl: 0 .  Prenatal Vit-Fe Fumarate-FA (MULTIVITAMIN-PRENATAL) 27-0.8 MG TABS tablet, Take 1 tablet by mouth daily at 12 noon., Disp: , Rfl:  Social History: Reviewed -  reports that she has quit smoking. Her smoking use included cigarettes. She smoked 1.00 pack per day. She has never used smokeless tobacco.  Review of Systems:   Constitutional: Negative for fever and chills Eyes: Negative for visual disturbances Respiratory: Negative for shortness of breath, dyspnea Cardiovascular: Negative for chest pain or palpitations  Gastrointestinal: Negative for vomiting, diarrhea and constipation;  no abdominal pain Genitourinary negative for vaginal irritation or itching Musculoskeletal: Negative for back pain, joint pain, myalgias  Neurological: Negative for dizziness and headaches    Objective Findings:    Physical Examination: Vitals:   11/09/17 1158  BP: 110/74  Pulse: (!) 103   General appearance - well appearing, and in no distress Mental status - alert, oriented to person, place, and time Chest:  Normal respiratory effort Heart - normal rate and regular rhythm Abdomen:  Soft, suprapubic tenderness Pelvic:  Musculoskeletal:  Normal range of motion without pain Extremities:  No edema    Results for orders placed or performed in visit on 11/09/17 (from the past 24 hour(s))  POCT urinalysis dipstick   Collection Time: 11/09/17 12:01 PM  Result Value Ref Range   Color, UA     Clarity, UA     Glucose, UA neg    Bilirubin, UA     Ketones, UA neg    Spec Grav, UA  1.010 - 1.025   Blood, UA large    pH, UA  5.0 - 8.0   Protein, UA trace    Urobilinogen, UA  0.2 or 1.0 E.U./dL   Nitrite, UA neg    Leukocytes, UA Negative Negative   Appearance     Odor        Assessment & Plan:  A:   UTI P:  rx macrobid and pyridiu   No follow-ups on file.  Jacklyn ShellFrances Cresenzo-Dishmon CNM 11/09/2017 12:16 PM

## 2017-11-11 LAB — URINE CULTURE: Organism ID, Bacteria: NO GROWTH

## 2018-10-02 ENCOUNTER — Ambulatory Visit (INDEPENDENT_AMBULATORY_CARE_PROVIDER_SITE_OTHER): Payer: BC Managed Care – PPO | Admitting: Family Medicine

## 2018-10-02 ENCOUNTER — Encounter: Payer: Self-pay | Admitting: Family Medicine

## 2018-10-02 ENCOUNTER — Other Ambulatory Visit: Payer: Self-pay

## 2018-10-02 VITALS — BP 109/64 | HR 112 | Ht 62.0 in | Wt 122.0 lb

## 2018-10-02 DIAGNOSIS — Z3046 Encounter for surveillance of implantable subdermal contraceptive: Secondary | ICD-10-CM | POA: Diagnosis not present

## 2018-10-02 DIAGNOSIS — R55 Syncope and collapse: Secondary | ICD-10-CM

## 2018-10-02 DIAGNOSIS — Z30013 Encounter for initial prescription of injectable contraceptive: Secondary | ICD-10-CM

## 2018-10-02 DIAGNOSIS — Z3202 Encounter for pregnancy test, result negative: Secondary | ICD-10-CM | POA: Diagnosis not present

## 2018-10-02 LAB — POCT URINE PREGNANCY: PREG TEST UR: NEGATIVE

## 2018-10-02 MED ORDER — MEDROXYPROGESTERONE ACETATE 150 MG/ML IM SUSP
150.0000 mg | INTRAMUSCULAR | 3 refills | Status: DC
Start: 2018-10-02 — End: 2020-01-14

## 2018-10-02 NOTE — Progress Notes (Signed)
    GYNECOLOGY PROBLEM  VISIT ENCOUNTER NOTE  Subjective:  Tammy Parks is a 23 y.o. G77P1001 female here for a a problem GYN visit.    Chief Complaint  Patient presents with  . Contraception    nexplanon removal    Denies abnormal vaginal bleeding, discharge, pelvic pain, problems with intercourse or other gynecologic concerns.    Gynecologic History No LMP recorded. Contraception: Nexplanon  Health Maintenance Due  Topic Date Due  . INFLUENZA VACCINE  02/16/2018     The following portions of the patient's history were reviewed and updated as appropriate: allergies, current medications, past family history, past medical history, past social history, past surgical history and problem list.  Review of Systems Pertinent items are noted in HPI.   Objective:  BP 109/64 (BP Location: Left Arm, Patient Position: Sitting, Cuff Size: Normal)   Pulse (!) 112   Ht 5\' 2"  (1.575 m)   Wt 122 lb (55.3 kg)   BMI 22.31 kg/m  Gen: well appearing, NAD HEENT: no scleral icterus CV: RR Lung: Normal WOB Ext: warm well perfused  Nexplanon Removal Patient identified, informed consent performed, consent signed.   Appropriate time out taken. Nexplanon site identified.  Area prepped in usual sterile fashon. One ml of 1% lidocaine was used to anesthetize the area at the distal end of the implant. A small stab incision was made right beside the implant on the distal portion.  The Nexplanon rod was grasped using hemostats and removed without difficulty.  There was minimal blood loss. There were no complications.  3 ml of 1% lidocaine was injected around the incision for post-procedure analgesia.  Steri-strips were applied over the small incision.  A pressure bandage was applied to reduce any bruising.  The patient tolerated the procedure well and was given post procedure instructions.  Patient is planning to use Depo for contraception.    Assessment and Plan:  1. Pregnancy test negative  - POCT urine pregnancy  2. Encounter for initial prescription of injectable contraceptive Sent rx to pharmacy  3. Encounter for removal of subdermal contraceptive implant - Removed nexplanon per above - Discussed IUD as possible option, counseled on options in tiered based fashion - medroxyPROGESTERone (DEPO-PROVERA) 150 MG/ML injection; Inject 1 mL (150 mg total) into the muscle every 3 (three) months.  Dispense: 1 mL; Refill: 3   Please refer to After Visit Summary for other counseling recommendations.   Return for Depo q 3 months.  Federico Flake, MD, MPH, ABFM Attending Physician Faculty Practice- [facility]

## 2018-10-03 ENCOUNTER — Ambulatory Visit (INDEPENDENT_AMBULATORY_CARE_PROVIDER_SITE_OTHER): Payer: BC Managed Care – PPO | Admitting: *Deleted

## 2018-10-03 ENCOUNTER — Ambulatory Visit: Payer: Medicaid Other

## 2018-10-03 ENCOUNTER — Encounter: Payer: Self-pay | Admitting: *Deleted

## 2018-10-03 DIAGNOSIS — Z3042 Encounter for surveillance of injectable contraceptive: Secondary | ICD-10-CM | POA: Diagnosis not present

## 2018-10-03 MED ORDER — MEDROXYPROGESTERONE ACETATE 150 MG/ML IM SUSP
150.0000 mg | Freq: Once | INTRAMUSCULAR | Status: AC
  Administered 2018-10-03: 150 mg via INTRAMUSCULAR

## 2018-10-03 NOTE — Progress Notes (Addendum)
Pt given DepoProvera 150mg  IM left deltoid without complications. Advised to return in 12 weeks for next injection. Pt here yesterday with negative pregnancy test and no intercourse.

## 2018-12-26 ENCOUNTER — Other Ambulatory Visit: Payer: Self-pay

## 2018-12-26 ENCOUNTER — Encounter: Payer: Self-pay | Admitting: *Deleted

## 2018-12-26 ENCOUNTER — Ambulatory Visit (INDEPENDENT_AMBULATORY_CARE_PROVIDER_SITE_OTHER): Payer: BC Managed Care – PPO | Admitting: *Deleted

## 2018-12-26 DIAGNOSIS — Z3042 Encounter for surveillance of injectable contraceptive: Secondary | ICD-10-CM | POA: Diagnosis not present

## 2018-12-26 MED ORDER — MEDROXYPROGESTERONE ACETATE 150 MG/ML IM SUSP
150.0000 mg | Freq: Once | INTRAMUSCULAR | Status: AC
  Administered 2018-12-26: 150 mg via INTRAMUSCULAR

## 2018-12-26 NOTE — Progress Notes (Signed)
Depo Provera 150mg IM given in left deltoid with no complications. Pt to return in 12 weeks for next injection.  

## 2019-03-20 ENCOUNTER — Ambulatory Visit: Payer: Medicaid Other

## 2019-04-16 ENCOUNTER — Telehealth: Payer: Self-pay | Admitting: *Deleted

## 2019-04-16 ENCOUNTER — Other Ambulatory Visit: Payer: Self-pay | Admitting: Women's Health

## 2019-04-16 MED ORDER — MEGESTROL ACETATE 40 MG PO TABS: ORAL_TABLET | ORAL | 1 refills | Status: DC

## 2019-04-16 NOTE — Telephone Encounter (Signed)
Pt was due her Depo the beginning of Sept, but didn't get shot due to her schedule. Pt started bleeding on 03/20/19 and is having heavy bleeding. Pt changing pad every hour. Pt is sexually active. Can you order Megace? Please advise. Thanks! [place]

## 2019-04-16 NOTE — Telephone Encounter (Signed)
Pt aware of Kim's recommendations and voiced understanding. She will take a pregnancy test before starting med. Pt's boss won't be back before Wednesday so she won't be able to schedule Depo until after Wednesday. [place]

## 2019-04-16 NOTE — Telephone Encounter (Signed)
Patient states since she was due for Depo at the beginning of September and did not receive it.  She has been bleeding since and has some questions.

## 2019-07-31 ENCOUNTER — Ambulatory Visit: Payer: Medicaid Other | Attending: Internal Medicine

## 2019-08-02 ENCOUNTER — Ambulatory Visit: Payer: Medicaid Other | Attending: Internal Medicine

## 2019-08-02 ENCOUNTER — Other Ambulatory Visit: Payer: Self-pay

## 2019-08-02 DIAGNOSIS — Z20822 Contact with and (suspected) exposure to covid-19: Secondary | ICD-10-CM

## 2019-08-03 LAB — NOVEL CORONAVIRUS, NAA: SARS-CoV-2, NAA: NOT DETECTED

## 2019-11-30 ENCOUNTER — Ambulatory Visit (INDEPENDENT_AMBULATORY_CARE_PROVIDER_SITE_OTHER): Payer: Medicaid Other

## 2019-11-30 VITALS — BP 99/67 | HR 48 | Ht 62.0 in | Wt 132.0 lb

## 2019-11-30 DIAGNOSIS — Z3201 Encounter for pregnancy test, result positive: Secondary | ICD-10-CM

## 2019-11-30 DIAGNOSIS — N926 Irregular menstruation, unspecified: Secondary | ICD-10-CM

## 2019-11-30 LAB — POCT URINE PREGNANCY: Preg Test, Ur: POSITIVE — AB

## 2019-11-30 MED ORDER — PROMETHAZINE HCL 25 MG PO TABS: 25.0000 mg | ORAL_TABLET | Freq: Four times a day (QID) | ORAL | 1 refills | Status: DC | PRN

## 2019-11-30 NOTE — Addendum Note (Signed)
Addended by: Cyril Mourning A on: 11/30/2019 12:13 PM   Modules accepted: Orders

## 2019-11-30 NOTE — Progress Notes (Signed)
Chart reviewed for nurse visit. Agree with plan of care. Will rx phenergan  Adline Potter, NP 11/30/2019 12:12 PM

## 2019-11-30 NOTE — Progress Notes (Signed)
   NURSE VISIT- PREGNANCY CONFIRMATION   SUBJECTIVE:  Tammy Parks is a 24 y.o. G1P1001 female certain LMP 10-18-2019  Here for pregnancy confirmation.  Home pregnancy test +  She reports nausea and vomiting during the day  She  taking prenatal vitamins.    OBJECTIVE:    Appears well, in no apparent distress OB History  Gravida Para Term Preterm AB Living  1 1 1  0 0 1  SAB TAB Ectopic Multiple Live Births  0 0 0 0 1    # Outcome Date GA Lbr Len/2nd Weight Sex Delivery Anes PTL Lv  1 Term 06/29/17 [redacted]w[redacted]d 16:40 / 08:24 7 lb 2.5 oz (3.245 kg) F Vag-Vacuum EPI  LIV      ASSESSMENT:     PLAN: Schedule for dating ultrasound in 1 week .taking prenatal vitamin: note route jennifer griffin   Nausea medicines need   OB packet given yes:   [redacted]w[redacted]d  11/30/2019 11:37 AM

## 2019-12-10 ENCOUNTER — Other Ambulatory Visit: Payer: Self-pay | Admitting: Obstetrics and Gynecology

## 2019-12-10 DIAGNOSIS — O3680X0 Pregnancy with inconclusive fetal viability, not applicable or unspecified: Secondary | ICD-10-CM

## 2019-12-11 ENCOUNTER — Ambulatory Visit (INDEPENDENT_AMBULATORY_CARE_PROVIDER_SITE_OTHER): Payer: Self-pay

## 2019-12-11 DIAGNOSIS — Z3401 Encounter for supervision of normal first pregnancy, first trimester: Secondary | ICD-10-CM

## 2019-12-11 DIAGNOSIS — Z3A01 Less than 8 weeks gestation of pregnancy: Secondary | ICD-10-CM

## 2019-12-11 DIAGNOSIS — O3680X0 Pregnancy with inconclusive fetal viability, not applicable or unspecified: Secondary | ICD-10-CM

## 2019-12-11 NOTE — Progress Notes (Signed)
[place] 7+5 single IUP with YS,positive fht 173 bpm,normal left ovary,simple right corpus luteal cyst 3.1 x 2.8 x 2.8 cm

## 2020-01-11 ENCOUNTER — Other Ambulatory Visit: Payer: Self-pay | Admitting: Obstetrics & Gynecology

## 2020-01-11 DIAGNOSIS — Z3682 Encounter for antenatal screening for nuchal translucency: Secondary | ICD-10-CM

## 2020-01-14 ENCOUNTER — Ambulatory Visit (INDEPENDENT_AMBULATORY_CARE_PROVIDER_SITE_OTHER): Payer: Medicaid Other

## 2020-01-14 ENCOUNTER — Other Ambulatory Visit: Payer: Self-pay

## 2020-01-14 ENCOUNTER — Encounter: Payer: Self-pay | Admitting: Women's Health

## 2020-01-14 ENCOUNTER — Other Ambulatory Visit (HOSPITAL_COMMUNITY)
Admission: RE | Admit: 2020-01-14 | Discharge: 2020-01-14 | Disposition: A | Discharge status: Home / Self Care | Payer: Medicaid Other | Source: Ambulatory Visit | Attending: Obstetrics & Gynecology | Admitting: Obstetrics & Gynecology

## 2020-01-14 ENCOUNTER — Ambulatory Visit (INDEPENDENT_AMBULATORY_CARE_PROVIDER_SITE_OTHER): Payer: Medicaid Other | Admitting: Women's Health

## 2020-01-14 VITALS — BP 103/74 | HR 103 | Wt 137.0 lb

## 2020-01-14 DIAGNOSIS — O99331 Smoking (tobacco) complicating pregnancy, first trimester: Secondary | ICD-10-CM | POA: Diagnosis not present

## 2020-01-14 DIAGNOSIS — Z3682 Encounter for antenatal screening for nuchal translucency: Secondary | ICD-10-CM | POA: Diagnosis not present

## 2020-01-14 DIAGNOSIS — Z3480 Encounter for supervision of other normal pregnancy, unspecified trimester: Secondary | ICD-10-CM | POA: Diagnosis present

## 2020-01-14 DIAGNOSIS — F17200 Nicotine dependence, unspecified, uncomplicated: Secondary | ICD-10-CM

## 2020-01-14 DIAGNOSIS — Z3481 Encounter for supervision of other normal pregnancy, first trimester: Secondary | ICD-10-CM

## 2020-01-14 DIAGNOSIS — Z3A12 12 weeks gestation of pregnancy: Secondary | ICD-10-CM | POA: Insufficient documentation

## 2020-01-14 DIAGNOSIS — F418 Other specified anxiety disorders: Secondary | ICD-10-CM | POA: Diagnosis not present

## 2020-01-14 DIAGNOSIS — O99341 Other mental disorders complicating pregnancy, first trimester: Secondary | ICD-10-CM

## 2020-01-14 DIAGNOSIS — Z3490 Encounter for supervision of normal pregnancy, unspecified, unspecified trimester: Secondary | ICD-10-CM | POA: Insufficient documentation

## 2020-01-14 LAB — POCT URINALYSIS DIPSTICK OB
Blood, UA: NEGATIVE
Glucose, UA: NEGATIVE
Ketones, UA: NEGATIVE
Leukocytes, UA: NEGATIVE
Nitrite, UA: NEGATIVE
POC,PROTEIN,UA: NEGATIVE

## 2020-01-14 MED ORDER — BLOOD PRESSURE MONITOR MISC: 0 refills | Status: DC

## 2020-01-14 NOTE — Progress Notes (Signed)
INITIAL OBSTETRICAL VISIT Patient name: Tammy Parks MRN [id number]  Date of birth: [birth year] Chief Complaint:   Initial Prenatal Visit (nt/it)  History of Present Illness:   Tammy Parks is a 24 y.o. G50P1001 Caucasian female at [redacted]w[redacted]d by LMP c/w u/s at 8 weeks with an Estimated Date of Delivery: 07/24/20 being seen today for her initial obstetrical visit.   Her obstetrical history is significant for term uncomplicated VAVB x 1.   Today she reports no complaints.  Smokes 1pk/3d, down from 1.5ppd Dep/anx- no meds, doing well Depression screen [facility] 2/9 01/14/2020 09/13/2017 04/20/2017 11/17/2016 10/28/2016  Decreased Interest 0 0 0 3 3  Down, Depressed, Hopeless 0 0 0 0 3  PHQ - 2 Score 0 0 0 3 6  Altered sleeping 2 - - 3 3  Tired, decreased energy 2 - - 3 3  Change in appetite 0 - - 3 3  Feeling bad or failure about yourself  0 - - 0 0  Trouble concentrating 0 - - 0 0  Moving slowly or fidgety/restless 0 - - 0 0  Suicidal thoughts 0 - - 0 0  PHQ-9 Score 4 - - 12 15  Difficult doing work/chores - - - - Not difficult at all    Patient's last menstrual period was 10/18/2019. Last pap 11/17/16. Results were: normal Review of Systems:   Pertinent items are noted in HPI Denies cramping/contractions, leakage of fluid, vaginal bleeding, abnormal vaginal discharge w/ itching/odor/irritation, headaches, visual changes, shortness of breath, chest pain, abdominal pain, severe nausea/vomiting, or problems with urination or bowel movements unless otherwise stated above.  Pertinent History Reviewed:  Reviewed past medical,surgical, social, obstetrical and family history.  Reviewed problem list, medications and allergies. OB History  Gravida Para Term Preterm AB Living  2 1 1  0 0 1  SAB TAB Ectopic Multiple Live Births  0 0 0 0 1    # Outcome Date GA Lbr Len/2nd Weight Sex Delivery Anes PTL Lv  2 Current           1 Term 06/29/17 [redacted]w[redacted]d 16:40 / 08:24 7 lb 2.5 oz (3.245 kg) F  Vag-Vacuum EPI  LIV   Physical Assessment:   Vitals:   01/14/20 1510  BP: 103/74  Pulse: (!) 103  Weight: 137 lb (62.1 kg)  Body mass index is 25.06 kg/m.       Physical Examination:  General appearance - well appearing, and in no distress  Mental status - alert, oriented to person, place, and time  Psych:  She has a normal mood and affect  Skin - warm and dry, normal color, no suspicious lesions noted  Chest - effort normal, all lung fields clear to auscultation bilaterally  Heart - normal rate and regular rhythm  Abdomen - soft, nontender  Extremities:  No swelling or varicosities noted  Pelvic - VULVA: normal appearing vulva with no masses, tenderness or lesions  VAGINA: normal appearing vagina with normal color and discharge, no lesions  CERVIX: normal appearing cervix without discharge or lesions, no CMT  Thin prep pap is done w/ HR HPV cotesting  TODAY'S NT [place] 12+4 wks,measurements c/w dates,crl 68.96 mm,posterior placenta,NB present,NT 2.1 mm,normal ovaries   No results found for this or any previous visit (from the past 24 hour(s)).  Assessment & Plan:  1) Low-Risk Pregnancy G2P1001 at [redacted]w[redacted]d with an Estimated Date of Delivery: 07/24/20   2) Initial OB visit  3) Smoker: Smokes 1pk/3day, counseled x 3-73mins, advised cessation,  discussed risks to fetus while pregnant, to infant pp, and to herself. Offered Quitline[facility], accepted, referral sent.     4) Dep/anx> no meds, doing well  Meds:  Meds ordered this encounter  Medications  . Blood Pressure Monitor MISC    Sig: For regular home bp monitoring during pregnancy    Dispense:  1 each    Refill:  0    Z34.90    Initial labs obtained Continue prenatal vitamins Reviewed n/v relief measures and warning s/s to report Reviewed recommended weight gain based on pre-gravid BMI Encouraged well-balanced diet Genetic & carrier screening discussed: requests Panorama, NT/IT and Horizon 14  Ultrasound discussed; fetal survey:  requested CCNC completed> form faxed if has or is planning to apply for medicaid The nature of [employer] for [employer] with multiple MDs and other Advanced Practice Providers was explained to patient; also emphasized that fellows, residents, and students are part of our team. Does not have home bp cuff. Rx faxed to [facility]. Check bp weekly, let us know if >140/90.   Follow-up: Return in about 3 weeks (around 02/04/2020) for 2nd IT, in person, CNM.   Orders Placed This Encounter  Procedures  . Urine Culture  . Integrated 1  . Genetic Screening  . CBC/D/Plt+RPR+Rh+ABO+Rub Ab...  . Pain Management Screening Profile (10S)  . POC Urinalysis Dipstick OB    Cheral Marker CNM, [facility] 01/14/2020 3:56 PM

## 2020-01-14 NOTE — Progress Notes (Signed)
[place] 12+4 wks,measurements c/w dates,crl 68.96 mm,posterior placenta,NB present,NT 2.1 mm,normal ovaries

## 2020-01-14 NOTE — Patient Instructions (Signed)
Tammy Parks, I greatly value your feedback.  If you receive a survey following your visit with [place] today, we appreciate you taking the time to fill it out.  Thanks, [name], CNM, WHNP-BC   [facility] ([address] [place], [place] [address]) Entrance C, located off of [address] Free 24/7 valet parking   Nausea & Vomiting  Have saltine crackers or pretzels by your bed and eat a few bites before you raise your head out of bed in the morning  Eat small frequent meals throughout the day instead of large meals  Drink plenty of fluids throughout the day to stay hydrated, just don't drink a lot of fluids with your meals.  This can make your stomach fill up faster making you feel sick  Do not brush your teeth right after you eat  Products with real ginger are good for nausea, like ginger ale and ginger hard candy Make sure it says made with real ginger!  Sucking on sour candy like lemon heads is also good for nausea  If your prenatal vitamins make you nauseated, take them at night so you will sleep through the nausea  Sea Bands  If you feel like you need medicine for the nausea & vomiting please let us know  If you are unable to keep any fluids or food down please let us know   Constipation  Drink plenty of fluid, preferably water, throughout the day  Eat foods high in fiber such as fruits, vegetables, and grains  Exercise, such as walking, is a good way to keep your bowels regular  Drink warm fluids, especially warm prune juice, or decaf coffee  Eat a 1/2 cup of real oatmeal (not instant), 1/2 cup applesauce, and 1/2-1 cup warm prune juice every day  If needed, you may take Colace (docusate sodium) stool softener once or twice a day to help keep the stool soft.   If you still are having problems with constipation, you may take Miralax once daily as needed to help keep your bowels regular.   Home Blood Pressure Monitoring for  Patients   Your provider has recommended that you check your blood pressure (BP) at least once a week at home. If you do not have a blood pressure cuff at home, one will be provided for you. Contact your provider if you have not received your monitor within 1 week.   Helpful Tips for Accurate Home Blood Pressure Checks  . Don't smoke, exercise, or drink caffeine 30 minutes before checking your BP . Use the restroom before checking your BP (a full bladder can raise your pressure) . Relax in a comfortable upright chair . Feet on the ground . Left arm resting comfortably on a flat surface at the level of your heart . Legs uncrossed . Back supported . Sit quietly and don't talk . Place the cuff on your bare arm . Adjust snuggly, so that only two fingertips can fit between your skin and the top of the cuff . Check 2 readings separated by at least one minute . Keep a log of your BP readings . For a visual, please reference this diagram: http://ccnc.care/bpdiagram  Provider Name: [facility]     Phone: [phone]  Zone 1: ALL CLEAR  Continue to monitor your symptoms:  . BP reading is less than 140 (top number) or less than 90 (bottom number)  . No right upper stomach pain . No headaches or  seeing spots . No feeling nauseated or throwing up . No swelling in face and hands  Zone 2: CAUTION Call your doctor's office for any of the following:  . BP reading is greater than 140 (top number) or greater than 90 (bottom number)  . Stomach pain under your ribs in the middle or right side . Headaches or seeing spots . Feeling nauseated or throwing up . Swelling in face and hands  Zone 3: EMERGENCY  Seek immediate medical care if you have any of the following:  . BP reading is greater than160 (top number) or greater than 110 (bottom number) . Severe headaches not improving with Tylenol . Serious difficulty catching your breath . Any worsening symptoms from Zone 2    First Trimester  of Pregnancy The first trimester of pregnancy is from week 1 until the end of week 12 (months 1 through 3). A week after a sperm fertilizes an egg, the egg will implant on the wall of the uterus. This embryo will begin to develop into a baby. Genes from you and your partner are forming the baby. The female genes determine whether the baby is a boy or a girl. At 6-8 weeks, the eyes and face are formed, and the heartbeat can be seen on ultrasound. At the end of 12 weeks, all the baby's organs are formed.  Now that you are pregnant, you will want to do everything you can to have a healthy baby. Two of the most important things are to get good prenatal care and to follow your health care provider's instructions. Prenatal care is all the medical care you receive before the baby's birth. This care will help prevent, find, and treat any problems during the pregnancy and childbirth. BODY CHANGES Your body goes through many changes during pregnancy. The changes vary from woman to woman.   You may gain or lose a couple of pounds at first.  You may feel sick to your stomach (nauseous) and throw up (vomit). If the vomiting is uncontrollable, call your health care provider.  You may tire easily.  You may develop headaches that can be relieved by medicines approved by your health care provider.  You may urinate more often. Painful urination may mean you have a bladder infection.  You may develop heartburn as a result of your pregnancy.  You may develop constipation because certain hormones are causing the muscles that push waste through your intestines to slow down.  You may develop hemorrhoids or swollen, bulging veins (varicose veins).  Your breasts may begin to grow larger and become tender. Your nipples may stick out more, and the tissue that surrounds them (areola) may become darker.  Your gums may bleed and may be sensitive to brushing and flossing.  Dark spots or blotches (chloasma, mask of  pregnancy) may develop on your face. This will likely fade after the baby is born.  Your menstrual periods will stop.  You may have a loss of appetite.  You may develop cravings for certain kinds of food.  You may have changes in your emotions from day to day, such as being excited to be pregnant or being concerned that something may go wrong with the pregnancy and baby.  You may have more vivid and strange dreams.  You may have changes in your hair. These can include thickening of your hair, rapid growth, and changes in texture. Some women also have hair loss during or after pregnancy, or hair that feels dry or thin. Your  hair will most likely return to normal after your baby is born. WHAT TO EXPECT AT YOUR PRENATAL VISITS During a routine prenatal visit:  You will be weighed to make sure you and the baby are growing normally.  Your blood pressure will be taken.  Your abdomen will be measured to track your baby's growth.  The fetal heartbeat will be listened to starting around week 10 or 12 of your pregnancy.  Test results from any previous visits will be discussed. Your health care provider may ask you:  How you are feeling.  If you are feeling the baby move.  If you have had any abnormal symptoms, such as leaking fluid, bleeding, severe headaches, or abdominal cramping.  If you have any questions. Other tests that may be performed during your first trimester include:  Blood tests to find your blood type and to check for the presence of any previous infections. They will also be used to check for low iron levels (anemia) and Rh antibodies. Later in the pregnancy, blood tests for diabetes will be done along with other tests if problems develop.  Urine tests to check for infections, diabetes, or protein in the urine.  An ultrasound to confirm the proper growth and development of the baby.  An amniocentesis to check for possible genetic problems.  Fetal screens for spina  bifida and Down syndrome.  You may need other tests to make sure you and the baby are doing well. HOME CARE INSTRUCTIONS  Medicines  Follow your health care provider's instructions regarding medicine use. Specific medicines may be either safe or unsafe to take during pregnancy.  Take your prenatal vitamins as directed.  If you develop constipation, try taking a stool softener if your health care provider approves. Diet  Eat regular, well-balanced meals. Choose a variety of foods, such as meat or vegetable-based protein, fish, milk and low-fat dairy products, vegetables, fruits, and whole grain breads and cereals. Your health care provider will help you determine the amount of weight gain that is right for you.  Avoid raw meat and uncooked cheese. These carry germs that can cause birth defects in the baby.  Eating four or five small meals rather than three large meals a day may help relieve nausea and vomiting. If you start to feel nauseous, eating a few soda crackers can be helpful. Drinking liquids between meals instead of during meals also seems to help nausea and vomiting.  If you develop constipation, eat more high-fiber foods, such as fresh vegetables or fruit and whole grains. Drink enough fluids to keep your urine clear or pale yellow. Activity and Exercise  Exercise only as directed by your health care provider. Exercising will help you:  Control your weight.  Stay in shape.  Be prepared for labor and delivery.  Experiencing pain or cramping in the lower abdomen or low back is a good sign that you should stop exercising. Check with your health care provider before continuing normal exercises.  Try to avoid standing for long periods of time. Move your legs often if you must stand in one place for a long time.  Avoid heavy lifting.  Wear low-heeled shoes, and practice good posture.  You may continue to have sex unless your health care provider directs you  otherwise. Relief of Pain or Discomfort  Wear a good support bra for breast tenderness.    Take warm sitz baths to soothe any pain or discomfort caused by hemorrhoids. Use hemorrhoid cream if your health care provider  approves.    Rest with your legs elevated if you have leg cramps or low back pain.  If you develop varicose veins in your legs, wear support hose. Elevate your feet for 15 minutes, 3-4 times a day. Limit salt in your diet. Prenatal Care  Schedule your prenatal visits by the twelfth week of pregnancy. They are usually scheduled monthly at first, then more often in the last 2 months before delivery.  Write down your questions. Take them to your prenatal visits.  Keep all your prenatal visits as directed by your health care provider. Safety  Wear your seat belt at all times when driving.  Make a list of emergency phone numbers, including numbers for family, friends, the hospital, and police and fire departments. General Tips  Ask your health care provider for a referral to a local prenatal education class. Begin classes no later than at the beginning of month 6 of your pregnancy.  Ask for help if you have counseling or nutritional needs during pregnancy. Your health care provider can offer advice or refer you to specialists for help with various needs.  Do not use hot tubs, steam rooms, or saunas.  Do not douche or use tampons or scented sanitary pads.  Do not cross your legs for long periods of time.  Avoid cat litter boxes and soil used by cats. These carry germs that can cause birth defects in the baby and possibly loss of the fetus by miscarriage or stillbirth.  Avoid all smoking, herbs, alcohol, and medicines not prescribed by your health care provider. Chemicals in these affect the formation and growth of the baby.  Schedule a dentist appointment. At home, brush your teeth with a soft toothbrush and be gentle when you floss. SEEK MEDICAL CARE IF:   You have  dizziness.  You have mild pelvic cramps, pelvic pressure, or nagging pain in the abdominal area.  You have persistent nausea, vomiting, or diarrhea.  You have a bad smelling vaginal discharge.  You have pain with urination.  You notice increased swelling in your face, hands, legs, or ankles. SEEK IMMEDIATE MEDICAL CARE IF:   You have a fever.  You are leaking fluid from your vagina.  You have spotting or bleeding from your vagina.  You have severe abdominal cramping or pain.  You have rapid weight gain or loss.  You vomit blood or material that looks like coffee grounds.  You are exposed to [place] measles and have never had them.  You are exposed to fifth disease or chickenpox.  You develop a severe headache.  You have shortness of breath.  You have any kind of trauma, such as from a fall or a car accident. Document Released: 06/29/2001 Document Revised: 11/19/2013 Document Reviewed: 05/15/2013 [facility] Patient Information 2015 [place], [place]. This information is not intended to replace advice given to you by your health care provider. Make sure you discuss any questions you have with your health care provider.

## 2020-01-16 LAB — CBC/D/PLT+RPR+RH+ABO+RUB AB...
Antibody Screen: NEGATIVE
Basophils Absolute: 0 10*3/uL (ref 0.0–0.2)
Basos: 0 %
EOS (ABSOLUTE): 0.1 10*3/uL (ref 0.0–0.4)
Eos: 1 %
HCV Ab: <0.1 s/co ratio (ref 0.0–0.9)
HIV Screen 4th Generation wRfx: NONREACTIVE
Hematocrit: 41.2 % (ref 34.0–46.6)
Hemoglobin: 14 g/dL (ref 11.1–15.9)
Hepatitis B Surface Ag: NEGATIVE
Immature Grans (Abs): 0 10*3/uL (ref 0.0–0.1)
Immature Granulocytes: 0 %
Lymphocytes Absolute: 2 10*3/uL (ref 0.7–3.1)
Lymphs: 24 %
MCH: 32.2 pg (ref 26.6–33.0)
MCHC: 34 g/dL (ref 31.5–35.7)
MCV: 95 fL (ref 79–97)
Monocytes Absolute: 0.5 10*3/uL (ref 0.1–0.9)
Monocytes: 6 %
Neutrophils Absolute: 5.8 10*3/uL (ref 1.4–7.0)
Neutrophils: 69 %
Platelets: 292 10*3/uL (ref 150–450)
RBC: 4.35 x10E6/uL (ref 3.77–5.28)
RDW: 13 % (ref 11.7–15.4)
RPR Ser Ql: NONREACTIVE
Rh Factor: POSITIVE
Rubella Antibodies, IGG: 2.06 index (ref >0.99)
WBC: 8.4 10*3/uL (ref 3.4–10.8)

## 2020-01-16 LAB — INTEGRATED 1
Crown Rump Length: 69 mm
Gest. Age on Collection Date: 13 weeks
Maternal Age at EDD: 26.1 yr
Nuchal Translucency (NT): 2.1 mm
Number of Fetuses: 1
PAPP-A Value: 1563.9 ng/mL
Weight: 137 [lb_av]

## 2020-01-16 LAB — CYTOLOGY - PAP
Chlamydia: NEGATIVE
Comment: NEGATIVE
Comment: NORMAL
Diagnosis: NEGATIVE
Neisseria Gonorrhea: NEGATIVE

## 2020-01-16 LAB — HCV INTERPRETATION

## 2020-02-04 ENCOUNTER — Ambulatory Visit (INDEPENDENT_AMBULATORY_CARE_PROVIDER_SITE_OTHER): Payer: Medicaid Other | Admitting: Women's Health

## 2020-02-04 ENCOUNTER — Encounter: Payer: Self-pay | Admitting: Women's Health

## 2020-02-04 VITALS — BP 112/66 | HR 101 | Wt 140.0 lb

## 2020-02-04 DIAGNOSIS — Z363 Encounter for antenatal screening for malformations: Secondary | ICD-10-CM

## 2020-02-04 DIAGNOSIS — Z3482 Encounter for supervision of other normal pregnancy, second trimester: Secondary | ICD-10-CM

## 2020-02-04 DIAGNOSIS — Z3480 Encounter for supervision of other normal pregnancy, unspecified trimester: Secondary | ICD-10-CM

## 2020-02-04 DIAGNOSIS — Z3A15 15 weeks gestation of pregnancy: Secondary | ICD-10-CM

## 2020-02-04 DIAGNOSIS — Z1379 Encounter for other screening for genetic and chromosomal anomalies: Secondary | ICD-10-CM

## 2020-02-04 DIAGNOSIS — Z331 Pregnant state, incidental: Secondary | ICD-10-CM

## 2020-02-04 DIAGNOSIS — Z1389 Encounter for screening for other disorder: Secondary | ICD-10-CM

## 2020-02-04 LAB — POCT URINALYSIS DIPSTICK OB
Blood, UA: NEGATIVE
Glucose, UA: NEGATIVE
Ketones, UA: NEGATIVE
Leukocytes, UA: NEGATIVE
Nitrite, UA: NEGATIVE
POC,PROTEIN,UA: NEGATIVE

## 2020-02-04 NOTE — Patient Instructions (Signed)
Lillia Pauls, I greatly value your feedback.  If you receive a survey following your visit with [place] today, we appreciate you taking the time to fill it out.  Thanks, [name], CNM, WHNP-BC  [facility] ([address] [place], [place] [address]) Entrance C, located off of [address] valet parking  Go to [email or web address] to register for FREE online childbirth classes  [place] Pediatricians/Family Doctors:  [facility] [phone]            [facility] [phone]                 [facility] [phone] (usually not accepting new patients unless you have family there already, you are always welcome to call and ask)       [facility] Department [phone]       [facility] Pediatricians/Family Doctors:   [facility]: [phone]  [facility]: [phone]  [facility]: [phone]  [facility] Doctors:   [facility]: [phone]   [facility]: [phone]  [facility] Doctors:  [facility]: [phone]    Home Blood Pressure Monitoring for Patients   Your provider has recommended that you check your blood pressure (BP) at least once a week at home. If you do not have a blood pressure cuff at home, one will be provided for you. Contact your provider if you have not received your monitor within 1 week.   Helpful Tips for Accurate Home Blood Pressure Checks  . Don't smoke, exercise, or drink caffeine 30 minutes before checking your BP . Use the restroom before checking your BP (a full bladder can raise your pressure) . Relax in a comfortable upright chair . Feet on the ground . Left arm resting comfortably on a flat surface at the level of your heart . Legs uncrossed . Back supported . Sit quietly and don't talk . Place the cuff on your bare arm . Adjust  snuggly, so that only two fingertips can fit between your skin and the top of the cuff . Check 2 readings separated by at least one minute . Keep a log of your BP readings . For a visual, please reference this diagram: http://ccnc.care/bpdiagram  Provider Name: [facility]     Phone: [phone]  Zone 1: ALL CLEAR  Continue to monitor your symptoms:  . BP reading is less than 140 (top number) or less than 90 (bottom number)  . No right upper stomach pain . No headaches or seeing spots . No feeling nauseated or throwing up . No swelling in face and hands  Zone 2: CAUTION Call your doctor's office for any of the following:  . BP reading is greater than 140 (top number) or greater than 90 (bottom number)  . Stomach pain under your ribs in the middle or right side . Headaches or seeing spots . Feeling nauseated or throwing up . Swelling in face and hands  Zone 3: EMERGENCY  Seek immediate medical care if you have any of the following:  . BP reading is greater than160 (top number) or greater than 110 (bottom number) . Severe headaches not improving with Tylenol . Serious difficulty catching your breath . Any worsening symptoms from Zone 2     Second Trimester of Pregnancy The second trimester is from week 14 through week 27 (months 4 through 6). The second trimester is often a time when you feel your  best. Your body has adjusted to being pregnant, and you begin to feel better physically. Usually, morning sickness has lessened or quit completely, you may have more energy, and you may have an increase in appetite. The second trimester is also a time when the fetus is growing rapidly. At the end of the sixth month, the fetus is about 9 inches long and weighs about 1 pounds. You will likely begin to feel the baby move (quickening) between 16 and 20 weeks of pregnancy. Body changes during your second trimester Your body continues to go through many changes during your second  trimester. The changes vary from woman to woman.  Your weight will continue to increase. You will notice your lower abdomen bulging out.  You may begin to get stretch marks on your hips, abdomen, and breasts.  You may develop headaches that can be relieved by medicines. The medicines should be approved by your health care provider.  You may urinate more often because the fetus is pressing on your bladder.  You may develop or continue to have heartburn as a result of your pregnancy.  You may develop constipation because certain hormones are causing the muscles that push waste through your intestines to slow down.  You may develop hemorrhoids or swollen, bulging veins (varicose veins).  You may have back pain. This is caused by: ? Weight gain. ? Pregnancy hormones that are relaxing the joints in your pelvis. ? A shift in weight and the muscles that support your balance.  Your breasts will continue to grow and they will continue to become tender.  Your gums may bleed and may be sensitive to brushing and flossing.  Dark spots or blotches (chloasma, mask of pregnancy) may develop on your face. This will likely fade after the baby is born.  A dark line from your belly button to the pubic area (linea nigra) may appear. This will likely fade after the baby is born.  You may have changes in your hair. These can include thickening of your hair, rapid growth, and changes in texture. Some women also have hair loss during or after pregnancy, or hair that feels dry or thin. Your hair will most likely return to normal after your baby is born.  What to expect at prenatal visits During a routine prenatal visit:  You will be weighed to make sure you and the fetus are growing normally.  Your blood pressure will be taken.  Your abdomen will be measured to track your baby's growth.  The fetal heartbeat will be listened to.  Any test results from the previous visit will be discussed.  Your  health care provider may ask you:  How you are feeling.  If you are feeling the baby move.  If you have had any abnormal symptoms, such as leaking fluid, bleeding, severe headaches, or abdominal cramping.  If you are using any tobacco products, including cigarettes, chewing tobacco, and electronic cigarettes.  If you have any questions.  Other tests that may be performed during your second trimester include:  Blood tests that check for: ? Low iron levels (anemia). ? High blood sugar that affects pregnant women (gestational diabetes) between 64 and 28 weeks. ? Rh antibodies. This is to check for a protein on red blood cells (Rh factor).  Urine tests to check for infections, diabetes, or protein in the urine.  An ultrasound to confirm the proper growth and development of the baby.  An amniocentesis to check for possible genetic problems.  Fetal  screens for spina bifida and Down syndrome.  HIV (human immunodeficiency virus) testing. Routine prenatal testing includes screening for HIV, unless you choose not to have this test.  Follow these instructions at home: Medicines  Follow your health care provider's instructions regarding medicine use. Specific medicines may be either safe or unsafe to take during pregnancy.  Take a prenatal vitamin that contains at least 600 micrograms (mcg) of folic acid.  If you develop constipation, try taking a stool softener if your health care provider approves. Eating and drinking  Eat a balanced diet that includes fresh fruits and vegetables, whole grains, good sources of protein such as meat, eggs, or tofu, and low-fat dairy. Your health care provider will help you determine the amount of weight gain that is right for you.  Avoid raw meat and uncooked cheese. These carry germs that can cause birth defects in the baby.  If you have low calcium intake from food, talk to your health care provider about whether you should take a daily calcium  supplement.  Limit foods that are high in fat and processed sugars, such as fried and sweet foods.  To prevent constipation: ? Drink enough fluid to keep your urine clear or pale yellow. ? Eat foods that are high in fiber, such as fresh fruits and vegetables, whole grains, and beans. Activity  Exercise only as directed by your health care provider. Most women can continue their usual exercise routine during pregnancy. Try to exercise for 30 minutes at least 5 days a week. Stop exercising if you experience uterine contractions.  Avoid heavy lifting, wear low heel shoes, and practice good posture.  A sexual relationship may be continued unless your health care provider directs you otherwise. Relieving pain and discomfort  Wear a good support bra to prevent discomfort from breast tenderness.  Take warm sitz baths to soothe any pain or discomfort caused by hemorrhoids. Use hemorrhoid cream if your health care provider approves.  Rest with your legs elevated if you have leg cramps or low back pain.  If you develop varicose veins, wear support hose. Elevate your feet for 15 minutes, 3-4 times a day. Limit salt in your diet. Prenatal Care  Write down your questions. Take them to your prenatal visits.  Keep all your prenatal visits as told by your health care provider. This is important. Safety  Wear your seat belt at all times when driving.  Make a list of emergency phone numbers, including numbers for family, friends, the hospital, and police and fire departments. General instructions  Ask your health care provider for a referral to a local prenatal education class. Begin classes no later than the beginning of month 6 of your pregnancy.  Ask for help if you have counseling or nutritional needs during pregnancy. Your health care provider can offer advice or refer you to specialists for help with various needs.  Do not use hot tubs, steam rooms, or saunas.  Do not douche or use  tampons or scented sanitary pads.  Do not cross your legs for long periods of time.  Avoid cat litter boxes and soil used by cats. These carry germs that can cause birth defects in the baby and possibly loss of the fetus by miscarriage or stillbirth.  Avoid all smoking, herbs, alcohol, and unprescribed drugs. Chemicals in these products can affect the formation and growth of the baby.  Do not use any products that contain nicotine or tobacco, such as cigarettes and e-cigarettes. If you need help  quitting, ask your health care provider.  Visit your dentist if you have not gone yet during your pregnancy. Use a soft toothbrush to brush your teeth and be gentle when you floss. Contact a health care provider if:  You have dizziness.  You have mild pelvic cramps, pelvic pressure, or nagging pain in the abdominal area.  You have persistent nausea, vomiting, or diarrhea.  You have a bad smelling vaginal discharge.  You have pain when you urinate. Get help right away if:  You have a fever.  You are leaking fluid from your vagina.  You have spotting or bleeding from your vagina.  You have severe abdominal cramping or pain.  You have rapid weight gain or weight loss.  You have shortness of breath with chest pain.  You notice sudden or extreme swelling of your face, hands, ankles, feet, or legs.  You have not felt your baby move in over an hour.  You have severe headaches that do not go away when you take medicine.  You have vision changes. Summary  The second trimester is from week 14 through week 27 (months 4 through 6). It is also a time when the fetus is growing rapidly.  Your body goes through many changes during pregnancy. The changes vary from woman to woman.  Avoid all smoking, herbs, alcohol, and unprescribed drugs. These chemicals affect the formation and growth your baby.  Do not use any tobacco products, such as cigarettes, chewing tobacco, and e-cigarettes. If you  need help quitting, ask your health care provider.  Contact your health care provider if you have any questions. Keep all prenatal visits as told by your health care provider. This is important. This information is not intended to replace advice given to you by your health care provider. Make sure you discuss any questions you have with your health care provider. Document Released: 06/29/2001 Document Revised: 12/11/2015 Document Reviewed: 09/05/2012 Elsevier Interactive Patient Education  2017 [place] FLU! Because you are pregnant, we at [facility], along with the Centers for Disease Control (CDC), recommend that you receive the flu vaccine to protect yourself and your baby from the flu. The flu is more likely to cause severe illness in pregnant women than in women of reproductive age who are not pregnant. Changes in the immune system, heart, and lungs during pregnancy make pregnant women (and women up to two weeks postpartum) more prone to severe illness from flu, including illness resulting in hospitalization. Flu also may be harmful for a pregnant woman's developing baby. A common flu symptom is fever, which may be associated with neural tube defects and other adverse outcomes for a developing baby. Getting vaccinated can also help protect a baby after birth from flu. (Mom passes antibodies onto the developing baby during her pregnancy.)  A Flu Vaccine is the Best Protection Against Flu Getting a flu vaccine is the first and most important step in protecting against flu. Pregnant women should get a flu shot and not the live attenuated influenza vaccine (LAIV), also known as nasal spray flu vaccine. Flu vaccines given during pregnancy help protect both the mother and her baby from flu. Vaccination has been shown to reduce the risk of flu-associated acute respiratory infection in pregnant women by up to one-half. A 2018 study showed that getting a flu shot  reduced a pregnant woman's risk of being hospitalized with flu by an average of 40 percent. Pregnant women who get a  flu vaccine are also helping to protect their babies from flu illness for the first several months after their birth, when they are too young to get vaccinated.   A Long Record of Safety for Flu Shots in Pregnant Women Flu shots have been given to millions of pregnant women over many years with a good safety record. There is a lot of evidence that flu vaccines can be given safely during pregnancy; though these data are limited for the first trimester. The CDC recommends that pregnant women get vaccinated during any trimester of their pregnancy. It is very important for pregnant women to get the flu shot.   Other Preventive Actions In addition to getting a flu shot, pregnant women should take the same everyday preventive actions the CDC recommends of everyone, including covering coughs, washing hands often, and avoiding people who are sick.  Symptoms and Treatment If you get sick with flu symptoms call your doctor right away. There are antiviral drugs that can treat flu illness and prevent serious flu complications. The CDC recommends prompt treatment for people who have influenza infection or suspected influenza infection and who are at high risk of serious flu complications, such as people with asthma, diabetes (including gestational diabetes), or heart disease. Early treatment of influenza in hospitalized pregnant women has been shown to reduce the length of the hospital stay.  Symptoms Flu symptoms include fever, cough, sore throat, runny or stuffy nose, body aches, headache, chills and fatigue. Some people may also have vomiting and diarrhea. People may be infected with the flu and have respiratory symptoms without a fever.  Early Treatment is Important for Pregnant Women Treatment should begin as soon as possible because antiviral drugs work best when started early (within 48 hours  after symptoms start). Antiviral drugs can make your flu illness milder and make you feel better faster. They may also prevent serious health problems that can result from flu illness. Oral oseltamivir (Tamiflu) is the preferred treatment for pregnant women because it has the most studies available to suggest that it is safe and beneficial. Antiviral drugs require a prescription from your provider. Having a fever caused by flu infection or other infections early in pregnancy may be linked to birth defects in a baby. In addition to taking antiviral drugs, pregnant women who get a fever should treat their fever with Tylenol (acetaminophen) and contact their provider immediately.  When to [place] If you are pregnant and have any of these signs, seek care immediately:  Difficulty breathing or shortness of breath  Pain or pressure in the chest or abdomen  Sudden dizziness  Confusion  Severe or persistent vomiting  High fever that is not responding to Tylenol (or store brand equivalent)  Decreased or no movement of your baby  [email or web address]

## 2020-02-04 NOTE — Progress Notes (Signed)
LOW-RISK PREGNANCY VISIT Patient name: Tammy Parks MRN [id number]  Date of birth: Jun 28, 1996 Chief Complaint:   Routine Prenatal Visit  History of Present Illness:   Tammy Parks is a 23 y.o. G54P1001 female at [redacted]w[redacted]d with an Estimated Date of Delivery: 07/24/20 being seen today for ongoing management of a low-risk pregnancy.  Depression screen [facility] 2/9 01/14/2020 09/13/2017 04/20/2017 11/17/2016 10/28/2016  Decreased Interest 0 0 0 3 3  Down, Depressed, Hopeless 0 0 0 0 3  PHQ - 2 Score 0 0 0 3 6  Altered sleeping 2 - - 3 3  Tired, decreased energy 2 - - 3 3  Change in appetite 0 - - 3 3  Feeling bad or failure about yourself  0 - - 0 0  Trouble concentrating 0 - - 0 0  Moving slowly or fidgety/restless 0 - - 0 0  Suicidal thoughts 0 - - 0 0  PHQ-9 Score 4 - - 12 15  Difficult doing work/chores - - - - Not difficult at all    Today she reports went to [facility] few days ago. BP was 170s w/ home cuff, had headache, called after hours line and they recommended she get checked out. All bp's at hospital normal, labs normal. No h/o HTN. Contractions: Not present. Vag. Bleeding: None.  Movement: Absent. denies leaking of fluid. Review of Systems:   Pertinent items are noted in HPI Denies abnormal vaginal discharge w/ itching/odor/irritation, headaches, visual changes, shortness of breath, chest pain, abdominal pain, severe nausea/vomiting, or problems with urination or bowel movements unless otherwise stated above. Pertinent History Reviewed:  Reviewed past medical,surgical, social, obstetrical and family history.  Reviewed problem list, medications and allergies. Physical Assessment:   Vitals:   02/04/20 1444  BP: 112/66  Pulse: (!) 101  Weight: 140 lb (63.5 kg)  Body mass index is 25.61 kg/m.        Physical Examination:   General appearance: Well appearing, and in no distress  Mental status: Alert, oriented to person, place, and time  Skin: Warm &  dry  Cardiovascular: Normal heart rate noted  Respiratory: Normal respiratory effort, no distress  Abdomen: Soft, gravid, nontender, large mole  Pelvic: Cervical exam deferred         Extremities: Edema: None  Fetal Status: Fetal Heart Rate (bpm): 153   Movement: Absent    Chaperone: n/a    Results for orders placed or performed in visit on 02/04/20 (from the past 24 hour(s))  POC Urinalysis Dipstick OB   Collection Time: 02/04/20  2:50 PM  Result Value Ref Range   Color, UA     Clarity, UA     Glucose, UA Negative Negative   Bilirubin, UA     Ketones, UA n    Spec Grav, UA     Blood, UA n    pH, UA     POC,PROTEIN,UA Negative Negative, Trace, Small (1+), Moderate (2+), Large (3+), 4+   Urobilinogen, UA     Nitrite, UA n    Leukocytes, UA Negative Negative   Appearance     Odor      Assessment & Plan:  1) Low-risk pregnancy G2P1001 at [redacted]w[redacted]d with an Estimated Date of Delivery: 07/24/20   2) ED visit for high home bp, normal at ED and here, no h/o HTN, bring cuff in this week and we will check against ours for accuracy  3) Large mole on abdomen> recommended going to dermatologist    Meds:  No orders of the defined types were placed in this encounter.  Labs/procedures today: 2nd IT  Plan:  Continue routine obstetrical care  Next visit: prefers will be in person for anatomy u/s    Reviewed: Preterm labor symptoms and general obstetric precautions including but not limited to vaginal bleeding, contractions, leaking of fluid and fetal movement were reviewed in detail with the patient.  All questions were answered.   Follow-up: Return in about 3 weeks (around 02/25/2020) for LROB, [id number], CNM, in person. One day this week for bp check w/ nurse.  Orders Placed This Encounter  Procedures  . Urine Culture  . US OB Comp + 14 Wk  . INTEGRATED 2  . Pain Management Screening Profile (10S)  . POC Urinalysis Dipstick OB   Cheral Marker CNM, [facility] 02/04/2020 3:32 PM

## 2020-02-05 LAB — PMP SCREEN PROFILE (10S), URINE
Amphetamine Scrn, Ur: NEGATIVE ng/mL
BARBITURATE SCREEN URINE: NEGATIVE ng/mL
BENZODIAZEPINE SCREEN, URINE: NEGATIVE ng/mL
CANNABINOIDS UR QL SCN: NEGATIVE ng/mL
Cocaine (Metab) Scrn, Ur: NEGATIVE ng/mL
Creatinine(Crt), U: 138.9 mg/dL (ref 20.0–300.0)
Methadone Screen, Urine: NEGATIVE ng/mL
OXYCODONE+OXYMORPHONE UR QL SCN: NEGATIVE ng/mL
Opiate Scrn, Ur: NEGATIVE ng/mL
Ph of Urine: 6.2 (ref 4.5–8.9)
Phencyclidine Qn, Ur: NEGATIVE ng/mL
Propoxyphene Scrn, Ur: NEGATIVE ng/mL

## 2020-02-06 LAB — INTEGRATED 2
AFP MoM: 0.86
Alpha-Fetoprotein: 29.6 ng/mL
Crown Rump Length: 69 mm
DIA MoM: 1.58
DIA Value: 264.5 pg/mL
Estriol, Unconjugated: 0.99 ng/mL
Gest. Age on Collection Date: 13 weeks
Gestational Age: 16 weeks
Maternal Age at EDD: 26.1 yr
Nuchal Translucency (NT): 2.1 mm
Nuchal Translucency MoM: 1.34
Number of Fetuses: 1
PAPP-A MoM: 1.17
PAPP-A Value: 1563.9 ng/mL
Test Results:: NEGATIVE
Weight: 137 [lb_av]
Weight: 137 [lb_av]
hCG MoM: 1.47
hCG Value: 55.1 IU/mL
uE3 MoM: 1.04

## 2020-02-06 LAB — URINE CULTURE

## 2020-02-08 ENCOUNTER — Encounter: Payer: Self-pay | Admitting: *Deleted

## 2020-02-08 DIAGNOSIS — Z3480 Encounter for supervision of other normal pregnancy, unspecified trimester: Secondary | ICD-10-CM

## 2020-02-27 ENCOUNTER — Ambulatory Visit (INDEPENDENT_AMBULATORY_CARE_PROVIDER_SITE_OTHER): Payer: Medicaid Other

## 2020-02-27 ENCOUNTER — Encounter: Payer: Self-pay | Admitting: Advanced Practice Midwife

## 2020-02-27 ENCOUNTER — Ambulatory Visit (INDEPENDENT_AMBULATORY_CARE_PROVIDER_SITE_OTHER): Payer: Medicaid Other | Admitting: Advanced Practice Midwife

## 2020-02-27 VITALS — BP 100/61 | HR 61 | Wt 143.0 lb

## 2020-02-27 DIAGNOSIS — O99332 Smoking (tobacco) complicating pregnancy, second trimester: Secondary | ICD-10-CM

## 2020-02-27 DIAGNOSIS — Z3A18 18 weeks gestation of pregnancy: Secondary | ICD-10-CM | POA: Diagnosis not present

## 2020-02-27 DIAGNOSIS — Z363 Encounter for antenatal screening for malformations: Secondary | ICD-10-CM | POA: Diagnosis not present

## 2020-02-27 DIAGNOSIS — Z3482 Encounter for supervision of other normal pregnancy, second trimester: Secondary | ICD-10-CM

## 2020-02-27 DIAGNOSIS — Z331 Pregnant state, incidental: Secondary | ICD-10-CM

## 2020-02-27 DIAGNOSIS — Z1389 Encounter for screening for other disorder: Secondary | ICD-10-CM

## 2020-02-27 DIAGNOSIS — Z3480 Encounter for supervision of other normal pregnancy, unspecified trimester: Secondary | ICD-10-CM

## 2020-02-27 LAB — POCT URINALYSIS DIPSTICK OB
Blood, UA: NEGATIVE
Glucose, UA: NEGATIVE
Ketones, UA: NEGATIVE
Leukocytes, UA: NEGATIVE
Nitrite, UA: NEGATIVE
POC,PROTEIN,UA: NEGATIVE

## 2020-02-27 NOTE — Progress Notes (Signed)
   LOW-RISK PREGNANCY VISIT Patient name: Tammy Parks MRN [id number]  Date of birth: [birth year] Chief Complaint:   Routine Prenatal Visit (anatomy scan)  History of Present Illness:   Tammy Parks is a 24 y.o. G43P1001 female at [redacted]w[redacted]d with an Estimated Date of Delivery: 07/24/20 being seen today for ongoing management of a low-risk pregnancy.  Today she reports doing well, but still not able to cut her smoking down to less than 1/3 pack/day; has not heard back from [facility] Quitline. Contractions: Not present. Vag. Bleeding: None.  Movement: Present. denies leaking of fluid. Review of Systems:   Pertinent items are noted in HPI Denies abnormal vaginal discharge w/ itching/odor/irritation, headaches, visual changes, shortness of breath, chest pain, abdominal pain, severe nausea/vomiting, or problems with urination or bowel movements unless otherwise stated above. Pertinent History Reviewed:  Reviewed past medical,surgical, social, obstetrical and family history.  Reviewed problem list, medications and allergies. Physical Assessment:   Vitals:   02/27/20 1538  BP: 100/61  Pulse: 61  Weight: 143 lb (64.9 kg)  Body mass index is 26.16 kg/m.        Physical Examination:   General appearance: Well appearing, and in no distress  Mental status: Alert, oriented to person, place, and time  Skin: Warm & dry  Cardiovascular: Normal heart rate noted  Respiratory: Normal respiratory effort, no distress  Abdomen: Soft, gravid, nontender  Pelvic: Cervical exam deferred         Extremities: Edema: None  Fetal Status: Fetal Heart Rate (bpm): 144 u/s   Movement: Present     Anatomy u/s: [place] 18+6 wks,cephalic,cx 3.6 cm,posterior placenta gr 0,normal ovaries,svp of fluid 4.1 cm,RVEICF 1.9 mm,LVEICF 2.3 mm,fhr 144 bpm,EFW 290 g 75%,anatomy complete  Results for orders placed or performed in visit on 02/27/20 (from the past 24 hour(s))  POC Urinalysis Dipstick OB   Collection Time: 02/27/20   3:40 PM  Result Value Ref Range   Color, UA     Clarity, UA     Glucose, UA Negative Negative   Bilirubin, UA     Ketones, UA neg    Spec Grav, UA     Blood, UA neg    pH, UA     POC,PROTEIN,UA Negative Negative, Trace, Small (1+), Moderate (2+), Large (3+), 4+   Urobilinogen, UA     Nitrite, UA neg    Leukocytes, UA Negative Negative   Appearance     Odor      Assessment & Plan:  1) Low-risk pregnancy G2P1001 at [redacted]w[redacted]d with an Estimated Date of Delivery: 07/24/20   2) EICF x 2, 1.20mm and 2.73mm with neg Panorama and NT/IT> no f/u needed; written info given  3) Smoker, hasn't heard from [facility] Quitline referral> will have Tish f/u on this   Meds: No orders of the defined types were placed in this encounter.  Labs/procedures today: anatomy u/s  Plan:  Continue routine obstetrical care   Reviewed: Preterm labor symptoms and general obstetric precautions including but not limited to vaginal bleeding, contractions, leaking of fluid and fetal movement were reviewed in detail with the patient.  All questions were answered. Has home bp cuff. Check bp weekly, let us know if >140/90.   Follow-up: Return in about 4 weeks (around 03/26/2020) for LROB, in person.  Orders Placed This Encounter  Procedures  . POC Urinalysis Dipstick OB   Tammy Parks [facility] 02/27/2020 4:03 PM

## 2020-02-27 NOTE — Patient Instructions (Signed)
Tammy Parks, I greatly value your feedback.  If you receive a survey following your visit with [place] today, we appreciate you taking the time to fill it out.  Thanks, Philipp Deputy CNM  [facility] ([address] [place], [place] [address]) Entrance C, located off of [address] valet parking  Go to [email or web address] to register for FREE online childbirth classes  [place] Pediatricians/Family Doctors:  [facility] [phone]            [facility] [phone]                 [facility] [phone] (usually not accepting new patients unless you have family there already, you are always welcome to call and ask)       [facility] Department [phone]       [facility] Pediatricians/Family Doctors:   [facility]: [phone]  [facility]: [phone]  [facility]: [phone]  [facility] Doctors:   [facility]: [phone]   [facility]: [phone]  [facility] Doctors:  [facility]: [phone]    Home Blood Pressure Monitoring for Patients   Your provider has recommended that you check your blood pressure (BP) at least once a week at home. If you do not have a blood pressure cuff at home, one will be provided for you. Contact your provider if you have not received your monitor within 1 week.   Helpful Tips for Accurate Home Blood Pressure Checks  . Don't smoke, exercise, or drink caffeine 30 minutes before checking your BP . Use the restroom before checking your BP (a full bladder can raise your pressure) . Relax in a comfortable upright chair . Feet on the ground . Left arm resting comfortably on a flat surface at the level of your heart . Legs uncrossed . Back supported . Sit quietly and don't talk . Place the cuff on your bare arm . Adjust snuggly, so that  only two fingertips can fit between your skin and the top of the cuff . Check 2 readings separated by at least one minute . Keep a log of your BP readings . For a visual, please reference this diagram: http://ccnc.care/bpdiagram  Provider Name: [facility]     Phone: [phone]  Zone 1: ALL CLEAR  Continue to monitor your symptoms:  . BP reading is less than 140 (top number) or less than 90 (bottom number)  . No right upper stomach pain . No headaches or seeing spots . No feeling nauseated or throwing up . No swelling in face and hands  Zone 2: CAUTION Call your doctor's office for any of the following:  . BP reading is greater than 140 (top number) or greater than 90 (bottom number)  . Stomach pain under your ribs in the middle or right side . Headaches or seeing spots . Feeling nauseated or throwing up . Swelling in face and hands  Zone 3: EMERGENCY  Seek immediate medical care if you have any of the following:  . BP reading is greater than160 (top number) or greater than 110 (bottom number) . Severe headaches not improving with Tylenol . Serious difficulty catching your breath . Any worsening symptoms from Zone 2     Second Trimester of Pregnancy The second trimester is from week 14 through week 27 (months 4 through 6). The second trimester is often a time when you feel your best.  Your body has adjusted to being pregnant, and you begin to feel better physically. Usually, morning sickness has lessened or quit completely, you may have more energy, and you may have an increase in appetite. The second trimester is also a time when the fetus is growing rapidly. At the end of the sixth month, the fetus is about 9 inches long and weighs about 1 pounds. You will likely begin to feel the baby move (quickening) between 16 and 20 weeks of pregnancy. Body changes during your second trimester Your body continues to go through many changes during your second trimester. The changes  vary from woman to woman.  Your weight will continue to increase. You will notice your lower abdomen bulging out.  You may begin to get stretch marks on your hips, abdomen, and breasts.  You may develop headaches that can be relieved by medicines. The medicines should be approved by your health care provider.  You may urinate more often because the fetus is pressing on your bladder.  You may develop or continue to have heartburn as a result of your pregnancy.  You may develop constipation because certain hormones are causing the muscles that push waste through your intestines to slow down.  You may develop hemorrhoids or swollen, bulging veins (varicose veins).  You may have back pain. This is caused by: ? Weight gain. ? Pregnancy hormones that are relaxing the joints in your pelvis. ? A shift in weight and the muscles that support your balance.  Your breasts will continue to grow and they will continue to become tender.  Your gums may bleed and may be sensitive to brushing and flossing.  Dark spots or blotches (chloasma, mask of pregnancy) may develop on your face. This will likely fade after the baby is born.  A dark line from your belly button to the pubic area (linea nigra) may appear. This will likely fade after the baby is born.  You may have changes in your hair. These can include thickening of your hair, rapid growth, and changes in texture. Some women also have hair loss during or after pregnancy, or hair that feels dry or thin. Your hair will most likely return to normal after your baby is born.  What to expect at prenatal visits During a routine prenatal visit:  You will be weighed to make sure you and the fetus are growing normally.  Your blood pressure will be taken.  Your abdomen will be measured to track your baby's growth.  The fetal heartbeat will be listened to.  Any test results from the previous visit will be discussed.  Your health care provider may  ask you:  How you are feeling.  If you are feeling the baby move.  If you have had any abnormal symptoms, such as leaking fluid, bleeding, severe headaches, or abdominal cramping.  If you are using any tobacco products, including cigarettes, chewing tobacco, and electronic cigarettes.  If you have any questions.  Other tests that may be performed during your second trimester include:  Blood tests that check for: ? Low iron levels (anemia). ? High blood sugar that affects pregnant women (gestational diabetes) between 73 and 28 weeks. ? Rh antibodies. This is to check for a protein on red blood cells (Rh factor).  Urine tests to check for infections, diabetes, or protein in the urine.  An ultrasound to confirm the proper growth and development of the baby.  An amniocentesis to check for possible genetic problems.  Fetal screens  for spina bifida and Down syndrome.  HIV (human immunodeficiency virus) testing. Routine prenatal testing includes screening for HIV, unless you choose not to have this test.  Follow these instructions at home: Medicines  Follow your health care provider's instructions regarding medicine use. Specific medicines may be either safe or unsafe to take during pregnancy.  Take a prenatal vitamin that contains at least 600 micrograms (mcg) of folic acid.  If you develop constipation, try taking a stool softener if your health care provider approves. Eating and drinking  Eat a balanced diet that includes fresh fruits and vegetables, whole grains, good sources of protein such as meat, eggs, or tofu, and low-fat dairy. Your health care provider will help you determine the amount of weight gain that is right for you.  Avoid raw meat and uncooked cheese. These carry germs that can cause birth defects in the baby.  If you have low calcium intake from food, talk to your health care provider about whether you should take a daily calcium supplement.  Limit foods  that are high in fat and processed sugars, such as fried and sweet foods.  To prevent constipation: ? Drink enough fluid to keep your urine clear or pale yellow. ? Eat foods that are high in fiber, such as fresh fruits and vegetables, whole grains, and beans. Activity  Exercise only as directed by your health care provider. Most women can continue their usual exercise routine during pregnancy. Try to exercise for 30 minutes at least 5 days a week. Stop exercising if you experience uterine contractions.  Avoid heavy lifting, wear low heel shoes, and practice good posture.  A sexual relationship may be continued unless your health care provider directs you otherwise. Relieving pain and discomfort  Wear a good support bra to prevent discomfort from breast tenderness.  Take warm sitz baths to soothe any pain or discomfort caused by hemorrhoids. Use hemorrhoid cream if your health care provider approves.  Rest with your legs elevated if you have leg cramps or low back pain.  If you develop varicose veins, wear support hose. Elevate your feet for 15 minutes, 3-4 times a day. Limit salt in your diet. Prenatal Care  Write down your questions. Take them to your prenatal visits.  Keep all your prenatal visits as told by your health care provider. This is important. Safety  Wear your seat belt at all times when driving.  Make a list of emergency phone numbers, including numbers for family, friends, the hospital, and police and fire departments. General instructions  Ask your health care provider for a referral to a local prenatal education class. Begin classes no later than the beginning of month 6 of your pregnancy.  Ask for help if you have counseling or nutritional needs during pregnancy. Your health care provider can offer advice or refer you to specialists for help with various needs.  Do not use hot tubs, steam rooms, or saunas.  Do not douche or use tampons or scented sanitary  pads.  Do not cross your legs for long periods of time.  Avoid cat litter boxes and soil used by cats. These carry germs that can cause birth defects in the baby and possibly loss of the fetus by miscarriage or stillbirth.  Avoid all smoking, herbs, alcohol, and unprescribed drugs. Chemicals in these products can affect the formation and growth of the baby.  Do not use any products that contain nicotine or tobacco, such as cigarettes and e-cigarettes. If you need help quitting,  ask your health care provider.  Visit your dentist if you have not gone yet during your pregnancy. Use a soft toothbrush to brush your teeth and be gentle when you floss. Contact a health care provider if:  You have dizziness.  You have mild pelvic cramps, pelvic pressure, or nagging pain in the abdominal area.  You have persistent nausea, vomiting, or diarrhea.  You have a bad smelling vaginal discharge.  You have pain when you urinate. Get help right away if:  You have a fever.  You are leaking fluid from your vagina.  You have spotting or bleeding from your vagina.  You have severe abdominal cramping or pain.  You have rapid weight gain or weight loss.  You have shortness of breath with chest pain.  You notice sudden or extreme swelling of your face, hands, ankles, feet, or legs.  You have not felt your baby move in over an hour.  You have severe headaches that do not go away when you take medicine.  You have vision changes. Summary  The second trimester is from week 14 through week 27 (months 4 through 6). It is also a time when the fetus is growing rapidly.  Your body goes through many changes during pregnancy. The changes vary from woman to woman.  Avoid all smoking, herbs, alcohol, and unprescribed drugs. These chemicals affect the formation and growth your baby.  Do not use any tobacco products, such as cigarettes, chewing tobacco, and e-cigarettes. If you need help quitting, ask your  health care provider.  Contact your health care provider if you have any questions. Keep all prenatal visits as told by your health care provider. This is important. This information is not intended to replace advice given to you by your health care provider. Make sure you discuss any questions you have with your health care provider. Document Released: 06/29/2001 Document Revised: 12/11/2015 Document Reviewed: 09/05/2012 Elsevier Interactive Patient Education  2017 [place] FLU! Because you are pregnant, we at [facility], along with the Centers for Disease Control (CDC), recommend that you receive the flu vaccine to protect yourself and your baby from the flu. The flu is more likely to cause severe illness in pregnant women than in women of reproductive age who are not pregnant. Changes in the immune system, heart, and lungs during pregnancy make pregnant women (and women up to two weeks postpartum) more prone to severe illness from flu, including illness resulting in hospitalization. Flu also may be harmful for a pregnant woman's developing baby. A common flu symptom is fever, which may be associated with neural tube defects and other adverse outcomes for a developing baby. Getting vaccinated can also help protect a baby after birth from flu. (Mom passes antibodies onto the developing baby during her pregnancy.)  A Flu Vaccine is the Best Protection Against Flu Getting a flu vaccine is the first and most important step in protecting against flu. Pregnant women should get a flu shot and not the live attenuated influenza vaccine (LAIV), also known as nasal spray flu vaccine. Flu vaccines given during pregnancy help protect both the mother and her baby from flu. Vaccination has been shown to reduce the risk of flu-associated acute respiratory infection in pregnant women by up to one-half. A 2018 study showed that getting a flu shot reduced a pregnant woman's  risk of being hospitalized with flu by an average of 40 percent. Pregnant women who get a flu  vaccine are also helping to protect their babies from flu illness for the first several months after their birth, when they are too young to get vaccinated.   A Long Record of Safety for Flu Shots in Pregnant Women Flu shots have been given to millions of pregnant women over many years with a good safety record. There is a lot of evidence that flu vaccines can be given safely during pregnancy; though these data are limited for the first trimester. The CDC recommends that pregnant women get vaccinated during any trimester of their pregnancy. It is very important for pregnant women to get the flu shot.   Other Preventive Actions In addition to getting a flu shot, pregnant women should take the same everyday preventive actions the CDC recommends of everyone, including covering coughs, washing hands often, and avoiding people who are sick.  Symptoms and Treatment If you get sick with flu symptoms call your doctor right away. There are antiviral drugs that can treat flu illness and prevent serious flu complications. The CDC recommends prompt treatment for people who have influenza infection or suspected influenza infection and who are at high risk of serious flu complications, such as people with asthma, diabetes (including gestational diabetes), or heart disease. Early treatment of influenza in hospitalized pregnant women has been shown to reduce the length of the hospital stay.  Symptoms Flu symptoms include fever, cough, sore throat, runny or stuffy nose, body aches, headache, chills and fatigue. Some people may also have vomiting and diarrhea. People may be infected with the flu and have respiratory symptoms without a fever.  Early Treatment is Important for Pregnant Women Treatment should begin as soon as possible because antiviral drugs work best when started early (within 48 hours after symptoms  start). Antiviral drugs can make your flu illness milder and make you feel better faster. They may also prevent serious health problems that can result from flu illness. Oral oseltamivir (Tamiflu) is the preferred treatment for pregnant women because it has the most studies available to suggest that it is safe and beneficial. Antiviral drugs require a prescription from your provider. Having a fever caused by flu infection or other infections early in pregnancy may be linked to birth defects in a baby. In addition to taking antiviral drugs, pregnant women who get a fever should treat their fever with Tylenol (acetaminophen) and contact their provider immediately.  When to [place] If you are pregnant and have any of these signs, seek care immediately:  Difficulty breathing or shortness of breath  Pain or pressure in the chest or abdomen  Sudden dizziness  Confusion  Severe or persistent vomiting  High fever that is not responding to Tylenol (or store brand equivalent)  Decreased or no movement of your baby  [email or web address]

## 2020-02-27 NOTE — Progress Notes (Signed)
[place] 18+6 wks,cephalic,cx 3.6 cm,posterior placenta gr 0,normal ovaries,svp of fluid 4.1 cm,RVEICF 1.9 mm,LVEICF 2.3 mm,fhr 144 bpm,EFW 290 g 75%,anatomy complete

## 2020-03-26 ENCOUNTER — Encounter: Payer: Medicaid Other | Admitting: Advanced Practice Midwife

## 2020-05-13 ENCOUNTER — Other Ambulatory Visit: Payer: Medicaid Other

## 2020-05-13 ENCOUNTER — Ambulatory Visit (INDEPENDENT_AMBULATORY_CARE_PROVIDER_SITE_OTHER): Payer: Medicaid Other | Admitting: Women's Health

## 2020-05-13 ENCOUNTER — Encounter: Payer: Self-pay | Admitting: Women's Health

## 2020-05-13 VITALS — BP 102/56 | HR 75 | Wt 154.6 lb

## 2020-05-13 DIAGNOSIS — Z1389 Encounter for screening for other disorder: Secondary | ICD-10-CM

## 2020-05-13 DIAGNOSIS — Z3483 Encounter for supervision of other normal pregnancy, third trimester: Secondary | ICD-10-CM

## 2020-05-13 DIAGNOSIS — Z3480 Encounter for supervision of other normal pregnancy, unspecified trimester: Secondary | ICD-10-CM

## 2020-05-13 DIAGNOSIS — Z331 Pregnant state, incidental: Secondary | ICD-10-CM

## 2020-05-13 DIAGNOSIS — Z23 Encounter for immunization: Secondary | ICD-10-CM

## 2020-05-13 DIAGNOSIS — Z3A29 29 weeks gestation of pregnancy: Secondary | ICD-10-CM

## 2020-05-13 NOTE — Patient Instructions (Signed)
Tammy Parks, I greatly value your feedback.  If you receive a survey following your visit with [place] today, we appreciate you taking the time to fill it out.  Thanks, Tammy Parks, CNM, WHNP-BC   [facility] ([address] [place], [place] [address]) Entrance C, located off of [address] valet parking  Go to [email or web address] to register for FREE online childbirth classes   Call the office [phone]) or go to [facility] if:  You begin to have strong, frequent contractions  Your water breaks.  Sometimes it is a big gush of fluid, sometimes it is just a trickle that keeps getting your panties wet or running down your legs  You have vaginal bleeding.  It is normal to have a small amount of spotting if your cervix was checked.   You don't feel your baby moving like normal.  If you don't, get you something to eat and drink and lay down and focus on feeling your baby move.  You should feel at least 10 movements in 2 hours.  If you don't, you should call the office or go to [facility].    Tdap Vaccine  It is recommended that you get the Tdap vaccine during the third trimester of EACH pregnancy to help protect your baby from getting pertussis (whooping cough)  27-36 weeks is the BEST time to do this so that you can pass the protection on to your baby. During pregnancy is better than after pregnancy, but if you are unable to get it during pregnancy it will be offered at the hospital.   You can get this vaccine with [place], at the health department, your family doctor, or some local pharmacies  Everyone who will be around your baby should also be up-to-date on their vaccines before the baby comes. Adults (who are not pregnant) only need 1 dose of Tdap during adulthood.   [place] Pediatricians/Family Doctors:  [facility] [phone]            [facility] [phone]                 [facility] [phone] (usually not accepting new patients unless you have family there already, you are always welcome to call and ask)       [facility] [phone]       [facility] Pediatricians/Family Doctors:   [facility]: [phone]  [facility]: [phone]  [facility]: [phone]  [facility] Doctors:   [facility]: [phone]   [facility]: [phone]  [facility] Doctors:  [facility]: [phone]   Home Blood Pressure Monitoring for Patients   Your provider has recommended that you check your blood pressure (BP) at least once a week at home. If you do not have a blood pressure cuff at home, one will be provided for you. Contact your provider if you have not received your monitor within 1 week.   Helpful Tips for Accurate Home Blood Pressure Checks  . Don't smoke, exercise, or drink caffeine 30 minutes before checking your BP . Use the restroom before checking your BP (a full bladder can raise your pressure) . Relax in a comfortable upright chair . Feet on the ground . Left arm resting comfortably on a flat surface at the level of your heart . Legs uncrossed . Back supported . Sit quietly and don't talk . Place the cuff on your  bare arm . Adjust snuggly, so that only two fingertips can fit between your skin and the top of the cuff . Check 2 readings separated by at least one minute . Keep a log of your BP readings . For a visual, please reference this diagram: http://ccnc.care/bpdiagram  Provider Name: [facility]     Phone: [phone]  Zone 1: ALL CLEAR  Continue to monitor your symptoms:  . BP reading is less than 140 (top number) or less than 90 (bottom number)  . No right upper stomach pain . No headaches or seeing spots . No feeling nauseated or throwing up . No swelling in face and hands  Zone 2: CAUTION Call  your doctor's office for any of the following:  . BP reading is greater than 140 (top number) or greater than 90 (bottom number)  . Stomach pain under your ribs in the middle or right side . Headaches or seeing spots . Feeling nauseated or throwing up . Swelling in face and hands  Zone 3: EMERGENCY  Seek immediate medical care if you have any of the following:  . BP reading is greater than160 (top number) or greater than 110 (bottom number) . Severe headaches not improving with Tylenol . Serious difficulty catching your breath . Any worsening symptoms from Zone 2   Third Trimester of Pregnancy The third trimester is from week 29 through week 42, months 7 through 9. The third trimester is a time when the fetus is growing rapidly. At the end of the ninth month, the fetus is about 20 inches in length and weighs 6-10 pounds.  BODY CHANGES Your body goes through many changes during pregnancy. The changes vary from woman to woman.   Your weight will continue to increase. You can expect to gain 25-35 pounds (11-16 kg) by the end of the pregnancy.  You may begin to get stretch marks on your hips, abdomen, and breasts.  You may urinate more often because the fetus is moving lower into your pelvis and pressing on your bladder.  You may develop or continue to have heartburn as a result of your pregnancy.  You may develop constipation because certain hormones are causing the muscles that push waste through your intestines to slow down.  You may develop hemorrhoids or swollen, bulging veins (varicose veins).  You may have pelvic pain because of the weight gain and pregnancy hormones relaxing your joints between the bones in your pelvis. Backaches may result from overexertion of the muscles supporting your posture.  You may have changes in your hair. These can include thickening of your hair, rapid growth, and changes in texture. Some women also have hair loss during or after pregnancy, or hair  that feels dry or thin. Your hair will most likely return to normal after your baby is born.  Your breasts will continue to grow and be tender. A yellow discharge may leak from your breasts called colostrum.  Your belly button may stick out.  You may feel short of breath because of your expanding uterus.  You may notice the fetus "dropping," or moving lower in your abdomen.  You may have a bloody mucus discharge. This usually occurs a few days to a week before labor begins.  Your cervix becomes thin and soft (effaced) near your due date. WHAT TO EXPECT AT YOUR PRENATAL EXAMS  You will have prenatal exams every 2 weeks until week 36. Then, you will have weekly prenatal exams. During a routine prenatal visit:  You will be weighed to make sure you and the fetus are growing normally.  Your blood pressure is taken.  Your abdomen will be measured to track your baby's growth.  The fetal heartbeat will be listened to.  Any test results from the previous visit will be discussed.  You may have a cervical check near your due date to see if you have effaced. At around 36 weeks, your caregiver will check your cervix. At the same time, your caregiver will also perform a test on the secretions of the vaginal tissue. This test is to determine if a type of bacteria, Group B streptococcus, is present. Your caregiver will explain this further. Your caregiver may ask you:  What your birth plan is.  How you are feeling.  If you are feeling the baby move.  If you have had any abnormal symptoms, such as leaking fluid, bleeding, severe headaches, or abdominal cramping.  If you have any questions. Other tests or screenings that may be performed during your third trimester include:  Blood tests that check for low iron levels (anemia).  Fetal testing to check the health, activity level, and growth of the fetus. Testing is done if you have certain medical conditions or if there are problems during the  pregnancy. FALSE LABOR You may feel small, irregular contractions that eventually go away. These are called Braxton Hicks contractions, or false labor. Contractions may last for hours, days, or even weeks before true labor sets in. If contractions come at regular intervals, intensify, or become painful, it is best to be seen by your caregiver.  SIGNS OF LABOR   Menstrual-like cramps.  Contractions that are 5 minutes apart or less.  Contractions that start on the top of the uterus and spread down to the lower abdomen and back.  A sense of increased pelvic pressure or back pain.  A watery or bloody mucus discharge that comes from the vagina. If you have any of these signs before the 37th week of pregnancy, call your caregiver right away. You need to go to the hospital to get checked immediately. HOME CARE INSTRUCTIONS   Avoid all smoking, herbs, alcohol, and unprescribed drugs. These chemicals affect the formation and growth of the baby.  Follow your caregiver's instructions regarding medicine use. There are medicines that are either safe or unsafe to take during pregnancy.  Exercise only as directed by your caregiver. Experiencing uterine cramps is a good sign to stop exercising.  Continue to eat regular, healthy meals.  Wear a good support bra for breast tenderness.  Do not use hot tubs, steam rooms, or saunas.  Wear your seat belt at all times when driving.  Avoid raw meat, uncooked cheese, cat litter boxes, and soil used by cats. These carry germs that can cause birth defects in the baby.  Take your prenatal vitamins.  Try taking a stool softener (if your caregiver approves) if you develop constipation. Eat more high-fiber foods, such as fresh vegetables or fruit and whole grains. Drink plenty of fluids to keep your urine clear or pale yellow.  Take warm sitz baths to soothe any pain or discomfort caused by hemorrhoids. Use hemorrhoid cream if your caregiver approves.  If you  develop varicose veins, wear support hose. Elevate your feet for 15 minutes, 3-4 times a day. Limit salt in your diet.  Avoid heavy lifting, wear low heal shoes, and practice good posture.  Rest a lot with your legs elevated if you have leg cramps or low  back pain.  Visit your dentist if you have not gone during your pregnancy. Use a soft toothbrush to brush your teeth and be gentle when you floss.  A sexual relationship may be continued unless your caregiver directs you otherwise.  Do not travel far distances unless it is absolutely necessary and only with the approval of your caregiver.  Take prenatal classes to understand, practice, and ask questions about the labor and delivery.  Make a trial run to the hospital.  Pack your hospital bag.  Prepare the baby's nursery.  Continue to go to all your prenatal visits as directed by your caregiver. SEEK MEDICAL CARE IF:  You are unsure if you are in labor or if your water has broken.  You have dizziness.  You have mild pelvic cramps, pelvic pressure, or nagging pain in your abdominal area.  You have persistent nausea, vomiting, or diarrhea.  You have a bad smelling vaginal discharge.  You have pain with urination. SEEK IMMEDIATE MEDICAL CARE IF:   You have a fever.  You are leaking fluid from your vagina.  You have spotting or bleeding from your vagina.  You have severe abdominal cramping or pain.  You have rapid weight loss or gain.  You have shortness of breath with chest pain.  You notice sudden or extreme swelling of your face, hands, ankles, feet, or legs.  You have not felt your baby move in over an hour.  You have severe headaches that do not go away with medicine.  You have vision changes. Document Released: 06/29/2001 Document Revised: 07/10/2013 Document Reviewed: 09/05/2012 [facility] Patient Information 2015 [place], [place]. This information is not intended to replace advice given to you by your health  care provider. Make sure you discuss any questions you have with your health care provider.

## 2020-05-13 NOTE — Progress Notes (Signed)
   LOW-RISK PREGNANCY VISIT Patient name: Tammy Parks MRN [id number]  Date of birth: 04-Mar-1996 Chief Complaint:   Routine Prenatal Visit (PN2)  History of Present Illness:   Tammy Parks is a 24 y.o. G55P1001 female at [redacted]w[redacted]d with an Estimated Date of Delivery: 07/24/20 being seen today for ongoing management of a low-risk pregnancy.  Depression screen [facility] 2/9 05/13/2020 01/14/2020 09/13/2017 04/20/2017 11/17/2016  Decreased Interest 0 0 0 0 3  Down, Depressed, Hopeless 0 0 0 0 0  PHQ - 2 Score 0 0 0 0 3  Altered sleeping 0 2 - - 3  Tired, decreased energy 0 2 - - 3  Change in appetite 0 0 - - 3  Feeling bad or failure about yourself  0 0 - - 0  Trouble concentrating 0 0 - - 0  Moving slowly or fidgety/restless 0 0 - - 0  Suicidal thoughts 0 0 - - 0  PHQ-9 Score 0 4 - - 12  Difficult doing work/chores - - - - -    Today she reports no complaints. Contractions: Irritability. Vag. Bleeding: None.  Movement: Present. denies leaking of fluid. Review of Systems:   Pertinent items are noted in HPI Denies abnormal vaginal discharge w/ itching/odor/irritation, headaches, visual changes, shortness of breath, chest pain, abdominal pain, severe nausea/vomiting, or problems with urination or bowel movements unless otherwise stated above. Pertinent History Reviewed:  Reviewed past medical,surgical, social, obstetrical and family history.  Reviewed problem list, medications and allergies. Physical Assessment:   Vitals:   05/13/20 0857  BP: (!) 102/56  Pulse: 75  Weight: 154 lb 9.6 oz (70.1 kg)  Body mass index is 28.28 kg/m.        Physical Examination:   General appearance: Well appearing, and in no distress  Mental status: Alert, oriented to person, place, and time  Skin: Warm & dry  Cardiovascular: Normal heart rate noted  Respiratory: Normal respiratory effort, no distress  Abdomen: Soft, gravid, nontender  Pelvic: Cervical exam deferred         Extremities: Edema:  Trace  Fetal Status: Fetal Heart Rate (bpm): 139 Fundal Height: 28 cm Movement: Present    Chaperone: N/A   No results found for this or any previous visit (from the past 24 hour(s)).  Assessment & Plan:  1) Low-risk pregnancy G2P1001 at [redacted]w[redacted]d with an Estimated Date of Delivery: 07/24/20    Meds: No orders of the defined types were placed in this encounter.  Labs/procedures today: pn2, tdap, flu shot  Plan:  Continue routine obstetrical care  Next visit: prefers online    Reviewed: Preterm labor symptoms and general obstetric precautions including but not limited to vaginal bleeding, contractions, leaking of fluid and fetal movement were reviewed in detail with the patient.  All questions were answered. Has home bp cuff. Check bp weekly, let us know if >140/90.   Follow-up: Return in about 2 weeks (around 05/27/2020) for LROB, CNM, MyChart Video.  Orders Placed This Encounter  Procedures  . Tdap vaccine greater than or equal to 7yo IM  . Flu Vaccine QUAD 36+ mos IM  . POC Urinalysis Dipstick OB   Cheral Marker CNM, [facility] 05/13/2020 9:27 AM

## 2020-05-14 LAB — CBC
Hematocrit: 36.2 % (ref 34.0–46.6)
Hemoglobin: 12.1 g/dL (ref 11.1–15.9)
MCH: 30.4 pg (ref 26.6–33.0)
MCHC: 33.4 g/dL (ref 31.5–35.7)
MCV: 91 fL (ref 79–97)
Platelets: 294 10*3/uL (ref 150–450)
RBC: 3.98 x10E6/uL (ref 3.77–5.28)
RDW: 11.7 % (ref 11.7–15.4)
WBC: 11.9 10*3/uL — ABNORMAL HIGH (ref 3.4–10.8)

## 2020-05-14 LAB — ANTIBODY SCREEN: Antibody Screen: NEGATIVE

## 2020-05-14 LAB — GLUCOSE TOLERANCE, 2 HOURS W/ 1HR
Glucose, 1 hour: 141 mg/dL (ref 65–179)
Glucose, 2 hour: 109 mg/dL (ref 65–152)
Glucose, Fasting: 75 mg/dL (ref 65–91)

## 2020-05-14 LAB — RPR: RPR Ser Ql: NONREACTIVE

## 2020-05-14 LAB — HIV ANTIBODY (ROUTINE TESTING W REFLEX): HIV Screen 4th Generation wRfx: NONREACTIVE

## 2020-05-27 ENCOUNTER — Telehealth: Payer: Medicaid Other | Admitting: Women's Health

## 2020-07-07 ENCOUNTER — Ambulatory Visit (INDEPENDENT_AMBULATORY_CARE_PROVIDER_SITE_OTHER): Payer: Medicaid Other | Admitting: Women's Health

## 2020-07-07 ENCOUNTER — Other Ambulatory Visit (HOSPITAL_COMMUNITY)
Admission: RE | Admit: 2020-07-07 | Discharge: 2020-07-07 | Disposition: A | Discharge status: Home / Self Care | Payer: Medicaid Other | Source: Ambulatory Visit | Attending: Obstetrics & Gynecology | Admitting: Obstetrics & Gynecology

## 2020-07-07 ENCOUNTER — Other Ambulatory Visit: Payer: Self-pay

## 2020-07-07 ENCOUNTER — Encounter: Payer: Self-pay | Admitting: Women's Health

## 2020-07-07 VITALS — BP 115/77 | HR 81 | Wt 162.5 lb

## 2020-07-07 DIAGNOSIS — Z331 Pregnant state, incidental: Secondary | ICD-10-CM

## 2020-07-07 DIAGNOSIS — Z3480 Encounter for supervision of other normal pregnancy, unspecified trimester: Secondary | ICD-10-CM | POA: Insufficient documentation

## 2020-07-07 DIAGNOSIS — Z3A37 37 weeks gestation of pregnancy: Secondary | ICD-10-CM

## 2020-07-07 DIAGNOSIS — Z1389 Encounter for screening for other disorder: Secondary | ICD-10-CM

## 2020-07-07 DIAGNOSIS — Z3483 Encounter for supervision of other normal pregnancy, third trimester: Secondary | ICD-10-CM

## 2020-07-07 LAB — POCT URINALYSIS DIPSTICK OB
Blood, UA: NEGATIVE
Glucose, UA: NEGATIVE
Ketones, UA: NEGATIVE
Leukocytes, UA: NEGATIVE
Nitrite, UA: NEGATIVE
POC,PROTEIN,UA: NEGATIVE

## 2020-07-07 NOTE — Progress Notes (Signed)
LOW-RISK PREGNANCY VISIT Patient name: Tammy Parks MRN [id number]  Date of birth: [birth year] Chief Complaint:   Routine Prenatal Visit (Pain with BM's; GC/CHL)  History of Present Illness:   Tammy Parks is a 24 y.o. G2P1001 female at [redacted]w[redacted]d with an Estimated Date of Delivery: 07/24/20 being seen today for ongoing management of a low-risk pregnancy.  Depression screen [facility] 2/9 05/13/2020 01/14/2020 09/13/2017 04/20/2017 11/17/2016  Decreased Interest 0 0 0 0 3  Down, Depressed, Hopeless 0 0 0 0 0  PHQ - 2 Score 0 0 0 0 3  Altered sleeping 0 2 - - 3  Tired, decreased energy 0 2 - - 3  Change in appetite 0 0 - - 3  Feeling bad or failure about yourself  0 0 - - 0  Trouble concentrating 0 0 - - 0  Moving slowly or fidgety/restless 0 0 - - 0  Suicidal thoughts 0 0 - - 0  PHQ-9 Score 0 4 - - 12  Difficult doing work/chores - - - - -    Today she reports constipation, rectal pressure, pain w/ bm's, small amt brb at times. No care since 29wks, states d/t work.  Contractions: Irregular. Vag. Bleeding: None.  Movement: Present. denies leaking of fluid. Review of Systems:   Pertinent items are noted in HPI Denies abnormal vaginal discharge w/ itching/odor/irritation, headaches, visual changes, shortness of breath, chest pain, abdominal pain, severe nausea/vomiting, or problems with urination or bowel movements unless otherwise stated above. Pertinent History Reviewed:  Reviewed past medical,surgical, social, obstetrical and family history.  Reviewed problem list, medications and allergies. Physical Assessment:   Vitals:   07/07/20 1423  BP: 115/77  Pulse: 81  Weight: 162 lb 8 oz (73.7 kg)  Body mass index is 29.72 kg/m.        Physical Examination:   General appearance: Well appearing, and in no distress  Mental status: Alert, oriented to person, place, and time  Skin: Warm & dry  Cardiovascular: Normal heart rate noted  Respiratory: Normal respiratory effort, no  distress  Abdomen: Soft, gravid, nontender  Pelvic: Cervical exam performed  Dilation: 1.5 Effacement (%): Thick Station: -2   Rectal: no external hemorrhoids  Extremities: Edema: Trace  Fetal Status: Fetal Heart Rate (bpm): 145 Fundal Height: 36 cm Movement: Present Presentation: Vertex  Chaperone: Clint Bolder   Results for orders placed or performed in visit on 07/07/20 (from the past 24 hour(s))  POC Urinalysis Dipstick OB   Collection Time: 07/07/20  2:19 PM  Result Value Ref Range   Color, UA     Clarity, UA     Glucose, UA Negative Negative   Bilirubin, UA     Ketones, UA neg    Spec Grav, UA     Blood, UA neg    pH, UA     POC,PROTEIN,UA Negative Negative, Trace, Small (1+), Moderate (2+), Large (3+), 4+   Urobilinogen, UA     Nitrite, UA neg    Leukocytes, UA Negative Negative   Appearance     Odor      Assessment & Plan:  1) Low-risk pregnancy G2P1001 at [redacted]w[redacted]d with an Estimated Date of Delivery: 07/24/20   2) Constipation, hemorrhoids, try preparation-h, gave constipation prevention/relief measures  3) No care since 29wks> d/t work   Meds: No orders of the defined types were placed in this encounter.  Labs/procedures today: gbs, gc/ct, sve  Plan:  Continue routine obstetrical care  Next visit: prefers in person (  home bp cuff not accurate)  Reviewed: Term labor symptoms and general obstetric precautions including but not limited to vaginal bleeding, contractions, leaking of fluid and fetal movement were reviewed in detail with the patient.  All questions were answered.   Follow-up: Return in about 1 week (around 07/14/2020) for LROB, CNM, in person.  Future Appointments  Date Time Provider Department Center  07/14/2020 10:30 AM Cheral Marker, CNM [facility]    Orders Placed This Encounter  Procedures  . Strep Gp B NAA  . POC Urinalysis Dipstick OB   Cheral Marker CNM, [facility] 07/07/2020 2:47 PM

## 2020-07-07 NOTE — Patient Instructions (Addendum)
Tammy Parks, I greatly value your feedback.  If you receive a survey following your visit with [place] today, we appreciate you taking the time to fill it out.  Thanks, [name], CNM, WHNP-BC  [facility] ([address] [place], [place] [address]) Entrance C, located off of [address] [employer] [address] valet parking   Go to [email or web address] to register for FREE online childbirth classes   Preparation H  Constipation  Drink plenty of fluid, preferably water, throughout the day  Eat foods high in fiber such as fruits, vegetables, and grains  Exercise, such as walking, is a good way to keep your bowels regular  Drink warm fluids, especially warm prune juice, or decaf coffee  Eat a 1/2 cup of real oatmeal (not instant), 1/2 cup applesauce, and 1/2-1 cup warm prune juice every day  If needed, you may take Colace (docusate sodium) stool softener once or twice a day to help keep the stool soft. If you are pregnant, wait until you are out of your first trimester (12-14 weeks of pregnancy)  If you still are having problems with constipation, you may take Miralax once daily as needed to help keep your bowels regular.  If you are pregnant, wait until you are out of your first trimester (12-14 weeks of pregnancy)      Call the office [phone]) or go to [facility] if:  You begin to have strong, frequent contractions  Your water breaks.  Sometimes it is a big gush of fluid, sometimes it is just a trickle that keeps getting your panties wet or running down your legs  You have vaginal bleeding.  It is normal to have a small amount of spotting if your cervix was checked.   You don't feel your baby moving like normal.  If you don't, get you something to eat and drink and lay down and focus on feeling your baby move.  You should feel at least 10 movements in 2 hours.  If you don't, you should call the office or go to [facility].   Call the office  [phone]) or go to [facility] hospital for these signs of pre-eclampsia:  Severe headache that does not go away with Tylenol  Visual changes- seeing spots, double, blurred vision  Pain under your right breast or upper abdomen that does not go away with Tums or heartburn medicine  Nausea and/or vomiting  Severe swelling in your hands, feet, and face    Home Blood Pressure Monitoring for Patients   Your provider has recommended that you check your blood pressure (BP) at least once a week at home. If you do not have a blood pressure cuff at home, one will be provided for you. Contact your provider if you have not received your monitor within 1 week.   Helpful Tips for Accurate Home Blood Pressure Checks  . Don't smoke, exercise, or drink caffeine 30 minutes before checking your BP . Use the restroom before checking your BP (a full bladder can raise your pressure) . Relax in a comfortable upright chair . Feet on the ground . Left arm resting comfortably on a flat surface at the level of your heart . Legs uncrossed . Back supported . Sit quietly and don't talk . Place the cuff on your bare arm . Adjust snuggly, so that only two fingertips can fit between your skin and the top of the cuff . Check 2 readings separated by at least one minute .  Keep a log of your BP readings . For a visual, please reference this diagram: http://ccnc.care/bpdiagram  Provider Name: [facility]     Phone: [phone]  Zone 1: ALL CLEAR  Continue to monitor your symptoms:  . BP reading is less than 140 (top number) or less than 90 (bottom number)  . No right upper stomach pain . No headaches or seeing spots . No feeling nauseated or throwing up . No swelling in face and hands  Zone 2: CAUTION Call your doctor's office for any of the following:  . BP reading is greater than 140 (top number) or greater than 90 (bottom number)  . Stomach pain under your ribs in the middle or right side . Headaches  or seeing spots . Feeling nauseated or throwing up . Swelling in face and hands  Zone 3: EMERGENCY  Seek immediate medical care if you have any of the following:  . BP reading is greater than160 (top number) or greater than 110 (bottom number) . Severe headaches not improving with Tylenol . Serious difficulty catching your breath . Any worsening symptoms from Zone 2   Braxton Hicks Contractions Contractions of the uterus can occur throughout pregnancy, but they are not always a sign that you are in labor. You may have practice contractions called Braxton Hicks contractions. These false labor contractions are sometimes confused with true labor. What are Deberah Pelton contractions? Braxton Hicks contractions are tightening movements that occur in the muscles of the uterus before labor. Unlike true labor contractions, these contractions do not result in opening (dilation) and thinning of the cervix. Toward the end of pregnancy (32-34 weeks), Braxton Hicks contractions can happen more often and may become stronger. These contractions are sometimes difficult to tell apart from true labor because they can be very uncomfortable. You should not feel embarrassed if you go to the hospital with false labor. Sometimes, the only way to tell if you are in true labor is for your health care provider to look for changes in the cervix. The health care provider will do a physical exam and may monitor your contractions. If you are not in true labor, the exam should show that your cervix is not dilating and your water has not broken. If there are no other health problems associated with your pregnancy, it is completely safe for you to be sent home with false labor. You may continue to have Braxton Hicks contractions until you go into true labor. How to tell the difference between true labor and false labor True labor  Contractions last 30-70 seconds.  Contractions become very regular.  Discomfort is usually felt  in the top of the uterus, and it spreads to the lower abdomen and low back.  Contractions do not go away with walking.  Contractions usually become more intense and increase in frequency.  The cervix dilates and gets thinner. False labor  Contractions are usually shorter and not as strong as true labor contractions.  Contractions are usually irregular.  Contractions are often felt in the front of the lower abdomen and in the groin.  Contractions may go away when you walk around or change positions while lying down.  Contractions get weaker and are shorter-lasting as time goes on.  The cervix usually does not dilate or become thin. Follow these instructions at home:  1. Take over-the-counter and prescription medicines only as told by your health care provider. 2. Keep up with your usual exercises and follow other instructions from your health  care provider. 3. Eat and drink lightly if you think you are going into labor. 4. If Braxton Hicks contractions are making you uncomfortable: ? Change your position from lying down or resting to walking, or change from walking to resting. ? Sit and rest in a tub of warm water. ? Drink enough fluid to keep your urine pale yellow. Dehydration may cause these contractions. ? Do slow and deep breathing several times an hour. 5. Keep all follow-up prenatal visits as told by your health care provider. This is important. Contact a health care provider if:  You have a fever.  You have continuous pain in your abdomen. Get help right away if:  Your contractions become stronger, more regular, and closer together.  You have fluid leaking or gushing from your vagina.  You pass blood-tinged mucus (bloody show).  You have bleeding from your vagina.  You have low back pain that you never had before.  You feel your baby's head pushing down and causing pelvic pressure.  Your baby is not moving inside you as much as it used  to. Summary  Contractions that occur before labor are called Braxton Hicks contractions, false labor, or practice contractions.  Braxton Hicks contractions are usually shorter, weaker, farther apart, and less regular than true labor contractions. True labor contractions usually become progressively stronger and regular, and they become more frequent.  Manage discomfort from [facility] contractions by changing position, resting in a warm bath, drinking plenty of water, or practicing deep breathing. This information is not intended to replace advice given to you by your health care provider. Make sure you discuss any questions you have with your health care provider. Document Revised: 06/17/2017 Document Reviewed: 11/18/2016 Elsevier Patient Education  2020 [employer].

## 2020-07-07 NOTE — Progress Notes (Signed)
Blood with BM's.

## 2020-07-09 LAB — CERVICOVAGINAL ANCILLARY ONLY
Chlamydia: NEGATIVE
Comment: NEGATIVE
Comment: NORMAL
Neisseria Gonorrhea: NEGATIVE

## 2020-07-09 LAB — STREP GP B NAA: Strep Gp B NAA: NEGATIVE

## 2020-07-14 ENCOUNTER — Encounter: Payer: Self-pay | Admitting: Women's Health

## 2020-07-14 ENCOUNTER — Ambulatory Visit (INDEPENDENT_AMBULATORY_CARE_PROVIDER_SITE_OTHER): Payer: Medicaid Other | Admitting: Women's Health

## 2020-07-14 ENCOUNTER — Other Ambulatory Visit: Payer: Self-pay

## 2020-07-14 VITALS — BP 112/74 | HR 100 | Wt 166.0 lb

## 2020-07-14 DIAGNOSIS — Z1389 Encounter for screening for other disorder: Secondary | ICD-10-CM

## 2020-07-14 DIAGNOSIS — Z3483 Encounter for supervision of other normal pregnancy, third trimester: Secondary | ICD-10-CM

## 2020-07-14 DIAGNOSIS — Z3480 Encounter for supervision of other normal pregnancy, unspecified trimester: Secondary | ICD-10-CM

## 2020-07-14 DIAGNOSIS — Z331 Pregnant state, incidental: Secondary | ICD-10-CM

## 2020-07-14 LAB — POCT URINALYSIS DIPSTICK OB
Blood, UA: NEGATIVE
Glucose, UA: NEGATIVE
Ketones, UA: NEGATIVE
Leukocytes, UA: NEGATIVE
Nitrite, UA: NEGATIVE
POC,PROTEIN,UA: NEGATIVE

## 2020-07-14 NOTE — Progress Notes (Signed)
LOW-RISK PREGNANCY VISIT Patient name: Tammy Parks MRN [id number]  Date of birth: 01-14-1996 Chief Complaint:   Routine Prenatal Visit (Want to see if dilated)  History of Present Illness:   Tammy Parks is a 24 y.o. G2P1001 female at [redacted]w[redacted]d with an Estimated Date of Delivery: 07/24/20 being seen today for ongoing management of a low-risk pregnancy.  Depression screen [facility] 2/9 05/13/2020 01/14/2020 09/13/2017 04/20/2017 11/17/2016  Decreased Interest 0 0 0 0 3  Down, Depressed, Hopeless 0 0 0 0 0  PHQ - 2 Score 0 0 0 0 3  Altered sleeping 0 2 - - 3  Tired, decreased energy 0 2 - - 3  Change in appetite 0 0 - - 3  Feeling bad or failure about yourself  0 0 - - 0  Trouble concentrating 0 0 - - 0  Moving slowly or fidgety/restless 0 0 - - 0  Suicidal thoughts 0 0 - - 0  PHQ-9 Score 0 4 - - 12  Difficult doing work/chores - - - - -    Today she reports constipation, no good bm in about 11days. Tried miralax x 1 and vomited. Is keeping food down. Taking stool softener, tried fleets enema x 1, held x , had a couple of hard balls come out. . Contractions: Irregular.  .  Movement: Present. denies leaking of fluid. Review of Systems:   Pertinent items are noted in HPI Denies abnormal vaginal discharge w/ itching/odor/irritation, headaches, visual changes, shortness of breath, chest pain, abdominal pain, severe nausea/vomiting, or problems with urination or bowel movements unless otherwise stated above. Pertinent History Reviewed:  Reviewed past medical,surgical, social, obstetrical and family history.  Reviewed problem list, medications and allergies. Physical Assessment:   Vitals:   07/14/20 1056  BP: 112/74  Pulse: 100  Weight: 166 lb (75.3 kg)  Body mass index is 30.36 kg/m.        Physical Examination:   General appearance: Well appearing, and in no distress  Mental status: Alert, oriented to person, place, and time  Skin: Warm & dry  Cardiovascular: Normal  heart rate noted  Respiratory: Normal respiratory effort, no distress  Abdomen: Soft, gravid, nontender  Pelvic: Cervical exam performed  Dilation: 2.5 Effacement (%): 50 Station: -2  Extremities: Edema: None  Fetal Status: Fetal Heart Rate (bpm): 140 Fundal Height: 38 cm Movement: Present Presentation: Vertex  Chaperone: [employer]   Results for orders placed or performed in visit on 07/14/20 (from the past 24 hour(s))  POC Urinalysis Dipstick OB   Collection Time: 07/14/20 11:11 AM  Result Value Ref Range   Color, UA     Clarity, UA     Glucose, UA Negative Negative   Bilirubin, UA     Ketones, UA neg    Spec Grav, UA     Blood, UA neg    pH, UA     POC,PROTEIN,UA Negative Negative, Trace, Small (1+), Moderate (2+), Large (3+), 4+   Urobilinogen, UA     Nitrite, UA neg    Leukocytes, UA Negative Negative   Appearance     Odor      Assessment & Plan:  1) Low-risk pregnancy G2P1001 at [redacted]w[redacted]d with an Estimated Date of Delivery: 07/24/20   2) Constipation, no good bm in 11 days, only occ little balls. Miralax 4x/day, colace bid, fleets enema. If not helping let us know   Meds: No orders of the defined types were placed in this encounter.  Labs/procedures today:  sve  Plan:  Continue routine obstetrical care  Next visit: prefers in person    Reviewed: Term labor symptoms and general obstetric precautions including but not limited to vaginal bleeding, contractions, leaking of fluid and fetal movement were reviewed in detail with the patient.  All questions were answered.   Follow-up: Return in about 1 week (around 07/21/2020) for LROB, CNM, in person.  Future Appointments  Date Time Provider Department Center  07/21/2020  9:30 AM Eure, Amaryllis Dyke, MD [facility]    Orders Placed This Encounter  Procedures  . POC Urinalysis Dipstick OB   Cheral Marker CNM, [facility] 07/14/2020 11:26 AM

## 2020-07-14 NOTE — Patient Instructions (Addendum)
Tammy Parks, I greatly value your feedback.  If you receive a survey following your visit with [place] today, we appreciate you taking the time to fill it out.  Thanks, [name], CNM, WHNP-BC  [facility] at [facility] ([address] [place], [place] [address]) Entrance C, located off of [address] [employer] valet parking   Go to [email or web address] to register for FREE online childbirth classes   Constipation  Drink plenty of fluid, preferably water, throughout the day  Eat foods high in fiber such as fruits, vegetables, and grains  Exercise, such as walking, is a good way to keep your bowels regular  Drink warm fluids, especially warm prune juice, or decaf coffee  Eat a 1/2 cup of real oatmeal (not instant), 1/2 cup applesauce, and 1/2-1 cup warm prune juice every day  Colace (docusate sodium) stool softener twice daily  Miralax 4 times daily until pooping at least once/day, then can slowly decrease  Fleets enema      Call the office [phone]) or go to [facility] if:  You begin to have strong, frequent contractions  Your water breaks.  Sometimes it is a big gush of fluid, sometimes it is just a trickle that keeps getting your panties wet or running down your legs  You have vaginal bleeding.  It is normal to have a small amount of spotting if your cervix was checked.   You don't feel your baby moving like normal.  If you don't, get you something to eat and drink and lay down and focus on feeling your baby move.  You should feel at least 10 movements in 2 hours.  If you don't, you should call the office or go to [facility].   Call the office [phone]) or go to [facility] hospital for these signs of pre-eclampsia:  Severe headache that does not go away with Tylenol  Visual changes- seeing spots, double, blurred vision  Pain under your right breast or upper abdomen that does not go away with Tums or heartburn medicine  Nausea and/or  vomiting  Severe swelling in your hands, feet, and face    Home Blood Pressure Monitoring for Patients   Your provider has recommended that you check your blood pressure (BP) at least once a week at home. If you do not have a blood pressure cuff at home, one will be provided for you. Contact your provider if you have not received your monitor within 1 week.   Helpful Tips for Accurate Home Blood Pressure Checks  . Don't smoke, exercise, or drink caffeine 30 minutes before checking your BP . Use the restroom before checking your BP (a full bladder can raise your pressure) . Relax in a comfortable upright chair . Feet on the ground . Left arm resting comfortably on a flat surface at the level of your heart . Legs uncrossed . Back supported . Sit quietly and don't talk . Place the cuff on your bare arm . Adjust snuggly, so that only two fingertips can fit between your skin and the top of the cuff . Check 2 readings separated by at least one minute . Keep a log of your BP readings . For a visual, please reference this diagram: http://ccnc.care/bpdiagram  Provider Name: [facility]     Phone: [phone]  Zone 1: ALL CLEAR  Continue to monitor your symptoms:  . BP reading is less than 140 (top number) or less than 90 (bottom number)  .  No right upper stomach pain . No headaches or seeing spots . No feeling nauseated or throwing up . No swelling in face and hands  Zone 2: CAUTION Call your doctor's office for any of the following:  . BP reading is greater than 140 (top number) or greater than 90 (bottom number)  . Stomach pain under your ribs in the middle or right side . Headaches or seeing spots . Feeling nauseated or throwing up . Swelling in face and hands  Zone 3: EMERGENCY  Seek immediate medical care if you have any of the following:  . BP reading is greater than160 (top number) or greater than 110 (bottom number) . Severe headaches not improving with  Tylenol . Serious difficulty catching your breath . Any worsening symptoms from Zone 2   Braxton Hicks Contractions Contractions of the uterus can occur throughout pregnancy, but they are not always a sign that you are in labor. You may have practice contractions called Braxton Hicks contractions. These false labor contractions are sometimes confused with true labor. What are Deberah Pelton contractions? Braxton Hicks contractions are tightening movements that occur in the muscles of the uterus before labor. Unlike true labor contractions, these contractions do not result in opening (dilation) and thinning of the cervix. Toward the end of pregnancy (32-34 weeks), Braxton Hicks contractions can happen more often and may become stronger. These contractions are sometimes difficult to tell apart from true labor because they can be very uncomfortable. You should not feel embarrassed if you go to the hospital with false labor. Sometimes, the only way to tell if you are in true labor is for your health care provider to look for changes in the cervix. The health care provider will do a physical exam and may monitor your contractions. If you are not in true labor, the exam should show that your cervix is not dilating and your water has not broken. If there are no other health problems associated with your pregnancy, it is completely safe for you to be sent home with false labor. You may continue to have Braxton Hicks contractions until you go into true labor. How to tell the difference between true labor and false labor True labor  Contractions last 30-70 seconds.  Contractions become very regular.  Discomfort is usually felt in the top of the uterus, and it spreads to the lower abdomen and low back.  Contractions do not go away with walking.  Contractions usually become more intense and increase in frequency.  The cervix dilates and gets thinner. False labor  Contractions are usually shorter and not  as strong as true labor contractions.  Contractions are usually irregular.  Contractions are often felt in the front of the lower abdomen and in the groin.  Contractions may go away when you walk around or change positions while lying down.  Contractions get weaker and are shorter-lasting as time goes on.  The cervix usually does not dilate or become thin. Follow these instructions at home:  1. Take over-the-counter and prescription medicines only as told by your health care provider. 2. Keep up with your usual exercises and follow other instructions from your health care provider. 3. Eat and drink lightly if you think you are going into labor. 4. If Braxton Hicks contractions are making you uncomfortable: ? Change your position from lying down or resting to walking, or change from walking to resting. ? Sit and rest in a tub of warm water. ? Drink enough fluid to keep  your urine pale yellow. Dehydration may cause these contractions. ? Do slow and deep breathing several times an hour. 5. Keep all follow-up prenatal visits as told by your health care provider. This is important. Contact a health care provider if:  You have a fever.  You have continuous pain in your abdomen. Get help right away if:  Your contractions become stronger, more regular, and closer together.  You have fluid leaking or gushing from your vagina.  You pass blood-tinged mucus (bloody show).  You have bleeding from your vagina.  You have low back pain that you never had before.  You feel your baby's head pushing down and causing pelvic pressure.  Your baby is not moving inside you as much as it used to. Summary  Contractions that occur before labor are called Braxton Hicks contractions, false labor, or practice contractions.  Braxton Hicks contractions are usually shorter, weaker, farther apart, and less regular than true labor contractions. True labor contractions usually become progressively stronger  and regular, and they become more frequent.  Manage discomfort from [facility] contractions by changing position, resting in a warm bath, drinking plenty of water, or practicing deep breathing. This information is not intended to replace advice given to you by your health care provider. Make sure you discuss any questions you have with your health care provider. Document Revised: 06/17/2017 Document Reviewed: 11/18/2016 Elsevier Patient Education  2020 [employer].

## 2020-07-18 ENCOUNTER — Inpatient Hospital Stay (HOSPITAL_COMMUNITY)
Admission: AD | Admit: 2020-07-18 | Discharge: 2020-07-18 | Disposition: A | Discharge status: Home / Self Care | Payer: Medicaid Other | Attending: Obstetrics and Gynecology | Admitting: Obstetrics and Gynecology

## 2020-07-18 ENCOUNTER — Other Ambulatory Visit: Payer: Self-pay

## 2020-07-18 DIAGNOSIS — Z0371 Encounter for suspected problem with amniotic cavity and membrane ruled out: Secondary | ICD-10-CM | POA: Diagnosis not present

## 2020-07-18 DIAGNOSIS — Z3A39 39 weeks gestation of pregnancy: Secondary | ICD-10-CM | POA: Diagnosis not present

## 2020-07-18 DIAGNOSIS — O471 False labor at or after 37 completed weeks of gestation: Secondary | ICD-10-CM | POA: Insufficient documentation

## 2020-07-18 LAB — POCT FERN TEST: POCT Fern Test: NEGATIVE

## 2020-07-18 NOTE — MAU Note (Addendum)
Pt here with c/o leaking fluid since about 1815.  Mild menstrual cramps.  +FM

## 2020-07-18 NOTE — Discharge Instructions (Signed)
Braxton Hicks Contractions °Contractions of the uterus can occur throughout pregnancy, but they are not always a sign that you are in labor. You may have practice contractions called Braxton Hicks contractions. These false labor contractions are sometimes confused with true labor. °What are Braxton Hicks contractions? °Braxton Hicks contractions are tightening movements that occur in the muscles of the uterus before labor. Unlike true labor contractions, these contractions do not result in opening (dilation) and thinning of the cervix. Toward the end of pregnancy (32-34 weeks), Braxton Hicks contractions can happen more often and may become stronger. These contractions are sometimes difficult to tell apart from true labor because they can be very uncomfortable. You should not feel embarrassed if you go to the hospital with false labor. °Sometimes, the only way to tell if you are in true labor is for your health care provider to look for changes in the cervix. The health care provider will do a physical exam and may monitor your contractions. If you are not in true labor, the exam should show that your cervix is not dilating and your water has not broken. °If there are no other health problems associated with your pregnancy, it is completely safe for you to be sent home with false labor. You may continue to have Braxton Hicks contractions until you go into true labor. °How to tell the difference between true labor and false labor °True labor °· Contractions last 30-70 seconds. °· Contractions become very regular. °· Discomfort is usually felt in the top of the uterus, and it spreads to the lower abdomen and low back. °· Contractions do not go away with walking. °· Contractions usually become more intense and increase in frequency. °· The cervix dilates and gets thinner. °False labor °· Contractions are usually shorter and not as strong as true labor contractions. °· Contractions are usually irregular. °· Contractions  are often felt in the front of the lower abdomen and in the groin. °· Contractions may go away when you walk around or change positions while lying down. °· Contractions get weaker and are shorter-lasting as time goes on. °· The cervix usually does not dilate or become thin. °Follow these instructions at home: ° °· Take over-the-counter and prescription medicines only as told by your health care provider. °· Keep up with your usual exercises and follow other instructions from your health care provider. °· Eat and drink lightly if you think you are going into labor. °· If Braxton Hicks contractions are making you uncomfortable: °? Change your position from lying down or resting to walking, or change from walking to resting. °? Sit and rest in a tub of warm water. °? Drink enough fluid to keep your urine pale yellow. Dehydration may cause these contractions. °? Do slow and deep breathing several times an hour. °· Keep all follow-up prenatal visits as told by your health care provider. This is important. °Contact a health care provider if: °· You have a fever. °· You have continuous pain in your abdomen. °Get help right away if: °· Your contractions become stronger, more regular, and closer together. °· You have fluid leaking or gushing from your vagina. °· You pass blood-tinged mucus (bloody show). °· You have bleeding from your vagina. °· You have low back pain that you never had before. °· You feel your baby’s head pushing down and causing pelvic pressure. °· Your baby is not moving inside you as much as it used to. °Summary °· Contractions that occur before labor are   called Braxton Hicks contractions, false labor, or practice contractions. °· Braxton Hicks contractions are usually shorter, weaker, farther apart, and less regular than true labor contractions. True labor contractions usually become progressively stronger and regular, and they become more frequent. °· Manage discomfort from Braxton Hicks contractions  by changing position, resting in a warm bath, drinking plenty of water, or practicing deep breathing. °This information is not intended to replace advice given to you by your health care provider. Make sure you discuss any questions you have with your health care provider. °Document Revised: 06/17/2017 Document Reviewed: 11/18/2016 °Elsevier Patient Education © 2020 [employer] ° °

## 2020-07-18 NOTE — MAU Provider Note (Signed)
Chief Complaint  Patient presents with  . Rupture of Membranes     Event Date/Time   First Provider Initiated Contact with Patient 07/18/20 2252      S: Tammy Parks  is a 24 y.o. y.o. year old G56P1001 female at [redacted]w[redacted]d weeks gestation who presents to MAU reporting leaking of clear fluid since 1815. Underpants felt damp.    Contractions: Mild Vaginal bleeding: Denies Fetal movement: Nml  O:  Patient Vitals for the past 24 hrs:  BP Temp src Pulse Resp SpO2 Weight  07/18/20 2206 127/88 Oral (!) 118 17 97 % 76 kg   General: NAD Heart: Regular rate Lungs: Normal rate and effort Abd: Soft, NT, Gravid, S=D Pelvic: NEFG, Neg pooling, no blood.  Dilation: 2.5 Effacement (%): 50 Cervical Position: Posterior Station: -3 Presentation: Vertex Exam by:: J.Bellamy, RN  EFM: 140, Moderate variability, 15 x 15 accelerations, no decelerations Toco: Q 2-5 , Painless  Neg Fern   A: [redacted]w[redacted]d week IUP No evidence of SROM or labor FHR reactive  P: Discharge home in stable condition. Labor precautions and fetal kick counts. Follow-up as scheduled for prenatal visit or sooner as needed if symptoms worsen. Return to maternity admissions as needed if symptoms worsen.  Katrinka Blazing, [place], [place] 07/18/2020 10:52 PM  2

## 2020-07-19 DIAGNOSIS — O471 False labor at or after 37 completed weeks of gestation: Secondary | ICD-10-CM | POA: Diagnosis not present

## 2020-07-19 DIAGNOSIS — Z3A39 39 weeks gestation of pregnancy: Secondary | ICD-10-CM | POA: Diagnosis not present

## 2020-07-19 DIAGNOSIS — Z0371 Encounter for suspected problem with amniotic cavity and membrane ruled out: Secondary | ICD-10-CM | POA: Diagnosis present

## 2020-07-19 NOTE — L&D Delivery Note (Signed)
Delivery Note Called to bedside and patient complete and pushing. Bulging bag noted on exam so AROM performed with clear fluid after risks/benefits discussed. Patient previously SROM @0200 . At 6:32 AM a viable female was delivered via Vaginal, Spontaneous (Presentation: Left Occiput Anterior).  Shoulder cord noted on delivery, delivered through. APGAR:9,9; weight 3535g.   Placenta status: Spontaneous, Intact.  Cord: 3 vessels with the following complications: None. Post placental liletta placed after delivery of placenta, please refer to procedure note for further details.  Anesthesia: None Episiotomy: None Lacerations: 1st degree, hemostatic Est. Blood Loss (mL): 100  Mom to postpartum.  Baby to Couplet care / Skin to Skin.  07/27/2020, 6:53 AM

## 2020-07-21 ENCOUNTER — Other Ambulatory Visit: Payer: Self-pay

## 2020-07-21 ENCOUNTER — Ambulatory Visit (INDEPENDENT_AMBULATORY_CARE_PROVIDER_SITE_OTHER): Payer: Medicaid Other | Admitting: Obstetrics & Gynecology

## 2020-07-21 ENCOUNTER — Encounter: Payer: Self-pay | Admitting: Obstetrics & Gynecology

## 2020-07-21 VITALS — BP 117/78 | HR 97 | Wt 167.8 lb

## 2020-07-21 DIAGNOSIS — Z3480 Encounter for supervision of other normal pregnancy, unspecified trimester: Secondary | ICD-10-CM

## 2020-07-21 DIAGNOSIS — Z1389 Encounter for screening for other disorder: Secondary | ICD-10-CM

## 2020-07-21 DIAGNOSIS — Z331 Pregnant state, incidental: Secondary | ICD-10-CM

## 2020-07-21 LAB — POCT URINALYSIS DIPSTICK OB
Blood, UA: NEGATIVE
Glucose, UA: NEGATIVE
Ketones, UA: NEGATIVE
Leukocytes, UA: NEGATIVE
Nitrite, UA: NEGATIVE
POC,PROTEIN,UA: NEGATIVE

## 2020-07-21 NOTE — Progress Notes (Signed)
LOW-RISK PREGNANCY VISIT Patient name: Tammy Parks MRN [id number]  Date of birth: 19-Sep-1995 Chief Complaint:   Routine Prenatal Visit (Want cervix check)  History of Present Illness:   Tammy Parks is a 25 y.o. G2P1001 female at [redacted]w[redacted]d with an Estimated Date of Delivery: 07/24/20 being seen today for ongoing management of a low-risk pregnancy.  Depression screen [name] 2/9 05/13/2020 01/14/2020 09/13/2017 04/20/2017 11/17/2016  Decreased Interest 0 0 0 0 3  Down, Depressed, Hopeless 0 0 0 0 0  PHQ - 2 Score 0 0 0 0 3  Altered sleeping 0 2 - - 3  Tired, decreased energy 0 2 - - 3  Change in appetite 0 0 - - 3  Feeling bad or failure about yourself  0 0 - - 0  Trouble concentrating 0 0 - - 0  Moving slowly or fidgety/restless 0 0 - - 0  Suicidal thoughts 0 0 - - 0  PHQ-9 Score 0 4 - - 12  Difficult doing work/chores - - - - -    Today she reports no complaints. Contractions: Irregular.  .  Movement: Present. denies leaking of fluid. Review of Systems:   Pertinent items are noted in HPI Denies abnormal vaginal discharge w/ itching/odor/irritation, headaches, visual changes, shortness of breath, chest pain, abdominal pain, severe nausea/vomiting, or problems with urination or bowel movements unless otherwise stated above. Pertinent History Reviewed:  Reviewed past medical,surgical, social, obstetrical and family history.  Reviewed problem list, medications and allergies. Physical Assessment:   Vitals:   07/21/20 0946  BP: 117/78  Pulse: 97  Weight: 167 lb 12.8 oz (76.1 kg)  Body mass index is 30.69 kg/m.        Physical Examination:   General appearance: Well appearing, and in no distress  Mental status: Alert, oriented to person, place, and time  Skin: Warm & dry  Cardiovascular: Normal heart rate noted  Respiratory: Normal respiratory effort, no distress  Abdomen: Soft, gravid, nontender  Pelvic: Cervical exam performed  Dilation: 3.5 Effacement (%): 50  Station: -2  Extremities: Edema: None  Fetal Status: Fetal Heart Rate (bpm): 125 Fundal Height: 40 cm Movement: Present Presentation: Vertex  Chaperone: Jobe Marker    Results for orders placed or performed in visit on 07/21/20 (from the past 24 hour(s))  POC Urinalysis Dipstick OB   Collection Time: 07/21/20  9:50 AM  Result Value Ref Range   Color, UA     Clarity, UA     Glucose, UA Negative Negative   Bilirubin, UA     Ketones, UA neg    Spec Grav, UA     Blood, UA neg    pH, UA     POC,PROTEIN,UA Negative Negative, Trace, Small (1+), Moderate (2+), Large (3+), 4+   Urobilinogen, UA     Nitrite, UA neg    Leukocytes, UA Negative Negative   Appearance     Odor      Assessment & Plan:  1) Low-risk pregnancy G2P1001 at [redacted]w[redacted]d with an Estimated Date of Delivery: 07/24/20   2) pending post dates,    Meds: No orders of the defined types were placed in this encounter.  Labs/procedures today: NST next week  Plan:  Continue routine obstetrical care  Next visit: prefers in person    Reviewed: Preterm labor symptoms and general obstetric precautions including but not limited to vaginal bleeding, contractions, leaking of fluid and fetal movement were reviewed in detail with the patient.  All questions were  answered. Has home bp cuff. Rx faxed to . Check bp weekly, let us know if >140/90.   Follow-up: Return in about 1 week (around 07/28/2020) for NST, LROB.  Orders Placed This Encounter  Procedures  . POC Urinalysis Dipstick OB    Lazaro Arms, MD 07/21/2020 10:21 AM

## 2020-07-27 ENCOUNTER — Inpatient Hospital Stay (HOSPITAL_COMMUNITY)
Admission: AD | Admit: 2020-07-27 | Discharge: 2020-07-28 | DRG: 807 | Disposition: A | Discharge status: Home / Self Care | Payer: Medicaid Other | Attending: Obstetrics and Gynecology | Admitting: Obstetrics and Gynecology

## 2020-07-27 ENCOUNTER — Other Ambulatory Visit: Payer: Self-pay

## 2020-07-27 ENCOUNTER — Encounter (HOSPITAL_COMMUNITY): Payer: Self-pay | Admitting: Family Medicine

## 2020-07-27 DIAGNOSIS — Z20822 Contact with and (suspected) exposure to covid-19: Secondary | ICD-10-CM | POA: Diagnosis present

## 2020-07-27 DIAGNOSIS — O26893 Other specified pregnancy related conditions, third trimester: Secondary | ICD-10-CM | POA: Diagnosis present

## 2020-07-27 DIAGNOSIS — Z30430 Encounter for insertion of intrauterine contraceptive device: Secondary | ICD-10-CM

## 2020-07-27 DIAGNOSIS — Z3490 Encounter for supervision of normal pregnancy, unspecified, unspecified trimester: Secondary | ICD-10-CM

## 2020-07-27 DIAGNOSIS — Z3A40 40 weeks gestation of pregnancy: Secondary | ICD-10-CM

## 2020-07-27 DIAGNOSIS — O99334 Smoking (tobacco) complicating childbirth: Principal | ICD-10-CM | POA: Diagnosis present

## 2020-07-27 DIAGNOSIS — F17210 Nicotine dependence, cigarettes, uncomplicated: Secondary | ICD-10-CM | POA: Diagnosis present

## 2020-07-27 DIAGNOSIS — F418 Other specified anxiety disorders: Secondary | ICD-10-CM | POA: Diagnosis present

## 2020-07-27 DIAGNOSIS — O4202 Full-term premature rupture of membranes, onset of labor within 24 hours of rupture: Secondary | ICD-10-CM

## 2020-07-27 DIAGNOSIS — F17200 Nicotine dependence, unspecified, uncomplicated: Secondary | ICD-10-CM | POA: Diagnosis present

## 2020-07-27 DIAGNOSIS — Z975 Presence of (intrauterine) contraceptive device: Secondary | ICD-10-CM

## 2020-07-27 LAB — TYPE AND SCREEN
ABO/RH(D): O POS
Antibody Screen: NEGATIVE

## 2020-07-27 LAB — CBC
HCT: 40.3 % (ref 36.0–46.0)
Hemoglobin: 13.2 g/dL (ref 12.0–15.0)
MCH: 28.3 pg (ref 26.0–34.0)
MCHC: 32.8 g/dL (ref 30.0–36.0)
MCV: 86.5 fL (ref 80.0–100.0)
Platelets: 331 10*3/uL (ref 150–400)
RBC: 4.66 MIL/uL (ref 3.87–5.11)
RDW: 13.2 % (ref 11.5–15.5)
WBC: 17 10*3/uL — ABNORMAL HIGH (ref 4.0–10.5)
nRBC: 0 % (ref 0.0–0.2)

## 2020-07-27 LAB — POCT FERN TEST: POCT Fern Test: POSITIVE

## 2020-07-27 LAB — RPR: RPR Ser Ql: NONREACTIVE

## 2020-07-27 LAB — RESP PANEL BY RT-PCR (FLU A&B, COVID) ARPGX2
Influenza A by PCR: NEGATIVE
Influenza B by PCR: NEGATIVE
SARS Coronavirus 2 by RT PCR: NEGATIVE

## 2020-07-27 MED ORDER — ONDANSETRON HCL 4 MG/2ML IJ SOLN: 4.0000 mg | INTRAMUSCULAR | Status: DC | PRN

## 2020-07-27 MED ORDER — SENNOSIDES-DOCUSATE SODIUM 8.6-50 MG PO TABS
2.0000 | ORAL_TABLET | ORAL | Status: DC
  Administered 2020-07-27 – 2020-07-28 (×2): 2 via ORAL
  Filled 2020-07-27 (×2): qty 2

## 2020-07-27 MED ORDER — LIDOCAINE HCL (PF) 1 % IJ SOLN: 30.0000 mL | INTRAMUSCULAR | Status: DC | PRN

## 2020-07-27 MED ORDER — EPHEDRINE 5 MG/ML INJ: 10.0000 mg | INTRAVENOUS | Status: DC | PRN

## 2020-07-27 MED ORDER — OXYTOCIN-SODIUM CHLORIDE 30-0.9 UT/500ML-% IV SOLN
2.5000 [IU]/h | INTRAVENOUS | Status: DC
  Filled 2020-07-27: qty 500

## 2020-07-27 MED ORDER — IBUPROFEN 600 MG PO TABS
600.0000 mg | ORAL_TABLET | Freq: Four times a day (QID) | ORAL | Status: DC
  Administered 2020-07-27 – 2020-07-28 (×5): 600 mg via ORAL
  Filled 2020-07-27 (×5): qty 1

## 2020-07-27 MED ORDER — ACETAMINOPHEN 325 MG PO TABS
650.0000 mg | ORAL_TABLET | ORAL | Status: DC | PRN
  Administered 2020-07-27: 650 mg via ORAL
  Filled 2020-07-27: qty 2

## 2020-07-27 MED ORDER — LACTATED RINGERS IV SOLN: 500.0000 mL | INTRAVENOUS | Status: DC | PRN

## 2020-07-27 MED ORDER — SOD CITRATE-CITRIC ACID 500-334 MG/5ML PO SOLN: 30.0000 mL | ORAL | Status: DC | PRN

## 2020-07-27 MED ORDER — DIPHENHYDRAMINE HCL 25 MG PO CAPS: 25.0000 mg | ORAL_CAPSULE | Freq: Four times a day (QID) | ORAL | Status: DC | PRN

## 2020-07-27 MED ORDER — PHENYLEPHRINE 40 MCG/ML (10ML) SYRINGE FOR IV PUSH (FOR BLOOD PRESSURE SUPPORT): 80.0000 ug | PREFILLED_SYRINGE | INTRAVENOUS | Status: DC | PRN

## 2020-07-27 MED ORDER — WITCH HAZEL-GLYCERIN EX PADS: 1.0000 "application " | MEDICATED_PAD | CUTANEOUS | Status: DC | PRN

## 2020-07-27 MED ORDER — ONDANSETRON HCL 4 MG/2ML IJ SOLN: 4.0000 mg | Freq: Four times a day (QID) | INTRAMUSCULAR | Status: DC | PRN

## 2020-07-27 MED ORDER — SIMETHICONE 80 MG PO CHEW: 80.0000 mg | CHEWABLE_TABLET | ORAL | Status: DC | PRN

## 2020-07-27 MED ORDER — OXYCODONE-ACETAMINOPHEN 5-325 MG PO TABS: 2.0000 | ORAL_TABLET | ORAL | Status: DC | PRN

## 2020-07-27 MED ORDER — FENTANYL-BUPIVACAINE-NACL 0.5-0.125-0.9 MG/250ML-% EP SOLN: 12.0000 mL/h | EPIDURAL | Status: DC | PRN

## 2020-07-27 MED ORDER — ONDANSETRON HCL 4 MG PO TABS: 4.0000 mg | ORAL_TABLET | ORAL | Status: DC | PRN

## 2020-07-27 MED ORDER — OXYTOCIN 10 UNIT/ML IJ SOLN
10.0000 [IU] | Freq: Once | INTRAMUSCULAR | Status: AC
  Administered 2020-07-27: 10 [IU] via INTRAMUSCULAR
  Filled 2020-07-27: qty 1

## 2020-07-27 MED ORDER — LACTATED RINGERS IV SOLN: 500.0000 mL | Freq: Once | INTRAVENOUS | Status: DC

## 2020-07-27 MED ORDER — ACETAMINOPHEN 325 MG PO TABS
650.0000 mg | ORAL_TABLET | ORAL | Status: DC | PRN
Start: 2020-07-27 — End: 2020-07-27

## 2020-07-27 MED ORDER — OXYCODONE-ACETAMINOPHEN 5-325 MG PO TABS
1.0000 | ORAL_TABLET | ORAL | Status: DC | PRN
Start: 2020-07-27 — End: 2020-07-27

## 2020-07-27 MED ORDER — OXYTOCIN BOLUS FROM INFUSION: 333.0000 mL | Freq: Once | INTRAVENOUS | Status: DC

## 2020-07-27 MED ORDER — COCONUT OIL OIL: 1.0000 "application " | TOPICAL_OIL | Status: DC | PRN

## 2020-07-27 MED ORDER — LACTATED RINGERS IV SOLN: INTRAVENOUS | Status: DC

## 2020-07-27 MED ORDER — LEVONORGESTREL 19.5 MCG/DAY IU IUD
INTRAUTERINE_SYSTEM | Freq: Once | INTRAUTERINE | Status: AC
  Administered 2020-07-27: 1 via INTRAUTERINE
  Filled 2020-07-27: qty 1

## 2020-07-27 MED ORDER — DIBUCAINE (PERIANAL) 1 % EX OINT: 1.0000 "application " | TOPICAL_OINTMENT | CUTANEOUS | Status: DC | PRN

## 2020-07-27 MED ORDER — FENTANYL CITRATE (PF) 100 MCG/2ML IJ SOLN
50.0000 ug | INTRAMUSCULAR | Status: DC | PRN
  Administered 2020-07-27: 100 ug via INTRAVENOUS
  Filled 2020-07-27: qty 2

## 2020-07-27 MED ORDER — BENZOCAINE-MENTHOL 20-0.5 % EX AERO
1.0000 "application " | INHALATION_SPRAY | CUTANEOUS | Status: DC | PRN
  Administered 2020-07-27: 1 via TOPICAL
  Filled 2020-07-27: qty 56

## 2020-07-27 MED ORDER — DIPHENHYDRAMINE HCL 50 MG/ML IJ SOLN: 12.5000 mg | INTRAMUSCULAR | Status: DC | PRN

## 2020-07-27 MED ORDER — TETANUS-DIPHTH-ACELL PERTUSSIS 5-2.5-18.5 LF-MCG/0.5 IM SUSY: 0.5000 mL | PREFILLED_SYRINGE | Freq: Once | INTRAMUSCULAR | Status: DC

## 2020-07-27 MED ORDER — MEASLES, MUMPS & RUBELLA VAC IJ SOLR: 0.5000 mL | Freq: Once | INTRAMUSCULAR | Status: DC

## 2020-07-27 MED ORDER — PRENATAL MULTIVITAMIN CH
1.0000 | ORAL_TABLET | Freq: Every day | ORAL | Status: DC
  Administered 2020-07-27 – 2020-07-28 (×2): 1 via ORAL
  Filled 2020-07-27 (×2): qty 1

## 2020-07-27 NOTE — Procedures (Signed)
  Post-Placental IUD Insertion Procedure Note  Patient identified, informed consent signed prior to delivery, signed copy in chart, time out was performed.    Vaginal, labial and perineal areas thoroughly inspected for lacerations. First degree laceration identified - hemostatic, not repaired prior to insertion of IUD.    Liletta  - IUD grasped between sterile gloved fingers. Sterile lubrication applied to sterile gloved hand for ease of insertion. Fundus identified through abdominal wall using non-insertion hand. IUD inserted to fundus with bimanual technique. IUD carefully released at the fundus and insertion hand gently removed from vagina.    Patient given post procedure instructions and IUD care card with expiration date.  Patient is asked to keep IUD strings tucked in her vagina until her postpartum follow up visit in 4-6 weeks. Patient advised to abstain from sexual intercourse and pulling on strings before her follow-up visit. Patient verbalized an understanding of the plan of care and agrees.   Alric Seton, MD OB Fellow, Faculty [facility], Center for [facility] 07/27/2020 6:48 AM

## 2020-07-27 NOTE — Discharge Summary (Signed)
Postpartum Discharge Summary  Date of Service updated 07/28/20     Patient Name: Tammy Parks DOB: [birth year] MRN: [id number]  Date of admission: 07/27/2020 Delivery date:07/27/2020  Delivering provider: Arrie Senate  Date of discharge: 07/28/2020  Admitting diagnosis: Normal labor [O80, Z37.9] Intrauterine pregnancy: [redacted]w[redacted]d    Secondary diagnosis:  Active Problems:   Encounter for supervision of normal pregnancy, antepartum   Smoker   Depression with anxiety   Vaginal delivery   IUD (intrauterine device) in place  Additional problems: none    Discharge diagnosis: Term Pregnancy Delivered                                              Post partum procedures:post placental liletta placed Augmentation: AROM Complications: None  Hospital course: Onset of Labor With Vaginal Delivery      25y.o. yo G2P1001 at 452w3das admitted in Active Labor on 07/27/2020. Patient had an uncomplicated labor course as follows:  Membrane Rupture Time/Date: 12:00 AM ,07/27/2020   Delivery Method:Vaginal, Spontaneous  Episiotomy: None  Lacerations:  1st degree  Patient had an uncomplicated postpartum course.  She is ambulating, tolerating a regular diet, passing flatus, and urinating well. Patient is discharged home in stable condition on 07/28/20.  Newborn Data: Birth date:07/27/2020  Birth time:6:32 AM  Gender:Female  Living status:Living  Apgars:9 ,9  Weight:3535 g   Magnesium Sulfate received: No BMZ received: No Rhophylac:N/A MMR:N/A T-DaP:Given prenatally Flu: Yes Transfusion:No  Physical exam  Vitals:   07/27/20 1254 07/27/20 1600 07/27/20 2021 07/28/20 0534  BP: 119/73 113/73 113/85 106/72  Pulse:   71 (!) 57  Resp: _0 Temp: 98.1 F (36.7 C) 97.7 F (36.5 C) 97.9 F (36.6 C) (!) 97.5 F (36.4 C)  TempSrc: Oral Oral Oral Oral  SpO2:   98% 99%  Weight:       General: alert, cooperative and no distress Lochia: appropriate Uterine Fundus:  firm Incision: N/A DVT Evaluation: No evidence of DVT seen on physical exam. Labs: Lab Results  Component Value Date   WBC 17.0 (H) 07/27/2020   HGB 13.2 07/27/2020   HCT 40.3 07/27/2020   MCV 86.5 07/27/2020   PLT 331 07/27/2020   CMP Latest Ref Rng & Units 06/28/2017  Glucose 65 - 99 mg/dL 87  BUN 6 - 20 mg/dL 13  Creatinine 0.44 - 1.00 mg/dL 0.71  Sodium 135 - 145 mmol/L 136  Potassium 3.5 - 5.1 mmol/L 4.0  Chloride 101 - 111 mmol/L 106  CO2 22 - 32 mmol/L 19(L)  Calcium 8.9 - 10.3 mg/dL 9.1  Total Protein 6.5 - 8.1 g/dL 5.7(L)  Total Bilirubin 0.3 - 1.2 mg/dL 0.4  Alkaline Phos 38 - 126 U/L 186(H)  AST 15 - 41 U/L 21  ALT 14 - 54 U/L 13(L)   Edinburgh Score: Edinburgh Postnatal Depression Scale Screening Tool 07/28/2017  I have been able to laugh and see the funny side of things. 0  I have looked forward with enjoyment to things. 0  I have blamed myself unnecessarily when things went wrong. 0  I have been anxious or worried for no good reason. 0  I have felt scared or panicky for no good reason. 0  Things have been getting on top of me. 0  I have been so unhappy that I have  had difficulty sleeping. 0  I have felt sad or miserable. 0  I have been so unhappy that I have been crying. 0  The thought of harming myself has occurred to me. 0  Edinburgh Postnatal Depression Scale Total 0     After visit meds:  Allergies as of 07/28/2020   No Known Allergies     Medication List    TAKE these medications   acetaminophen 500 MG tablet Commonly known as: TYLENOL Take 2 tablets (1,000 mg total) by mouth every 4 (four) hours as needed (for pain scale < 4).   Blood Pressure Monitor Misc For regular home bp monitoring during pregnancy   coconut oil Oil Apply 1 application topically as needed.   ibuprofen 600 MG tablet Commonly known as: ADVIL Take 1 tablet (600 mg total) by mouth every 6 (six) hours.   prenatal vitamin w/FE, FA 29-1 MG Chew chewable tablet Chew 1  tablet by mouth daily at 12 noon.        Discharge home in stable condition Infant Feeding: Breast Infant Disposition:home with mother Discharge instruction: per After Visit Summary and Postpartum booklet. Activity: Advance as tolerated. Pelvic rest for 6 weeks.  Diet: routine diet Future Appointments: Future Appointments  Date Time Provider [place]  07/28/2020  2:30 PM Roma Schanz, CNM [facility]   Follow up Visit: Message sent to [facility] 07/27/20 by Sylvester Harder.   Please schedule this patient for a In person postpartum visit in 4 weeks with the following provider: Any provider. Additional Postpartum F/U:none  Low risk pregnancy complicated by: n/a Delivery mode:  Vaginal, Spontaneous  Anticipated Birth Control:  PP IUD placed, string check at postpartum visit   5/63/1497 Arrie Senate, MD

## 2020-07-27 NOTE — H&P (Cosign Needed)
OBSTETRIC ADMISSION HISTORY AND PHYSICAL  Tammy Parks is a 25 y.o. female G2P1001 with IUP at [redacted]w[redacted]d by LMP presenting for SOL. She reports SROM @0200 . She reports +FMs, No LOF, no VB, no blurry vision, headaches or peripheral edema, and RUQ pain.  She plans on breast feeding. She request post placental liletta for birth control. She received her prenatal [facility] at [facility]   Dating: By LMP --->  Estimated Date of Delivery: 07/24/20  Sono:    02/27/20@[redacted]w[redacted]d , CWD, normal anatomy, cephalic presentation, 290g, 75% EFW   Prenatal History/Complications:  Anxiety/dpression (no meds) Tobacco abuse Limited prenatal care (no care 29-37 wks)  Past Medical History: Past Medical History:  Diagnosis Date  . Anxiety   . Depression   . Scoliosis     Past Surgical History: Past Surgical History:  Procedure Laterality Date  . DENTAL SURGERY     pt sates over the years  . WISDOM TOOTH EXTRACTION      Obstetrical History: OB History    Gravida  2   Para  1   Term  1   Preterm  0   AB  0   Living  1     SAB  0   IAB  0   Ectopic  0   Multiple  0   Live Births  1           Social History Social History   Socioeconomic History  . Marital status: Married    Spouse name: Not on file  . Number of children: Not on file  . Years of education: Not on file  . Highest education level: Not on file  Occupational History  . Not on file  Tobacco Use  . Smoking status: Current Every Day Smoker    Packs/day: 0.25    Types: Cigarettes  . Smokeless tobacco: Never Used  Vaping Use  . Vaping Use: Never used  Substance and Sexual Activity  . Alcohol use: No    Alcohol/week: 0.0 standard drinks  . Drug use: No  . Sexual activity: Yes    Birth control/protection: None  Other Topics Concern  . Not on file  Social History Narrative  . Not on file   Social Determinants of Health   Financial Resource Strain: Low Risk   . Difficulty of Paying Living Expenses: Not  hard at all  Food Insecurity: No Food Insecurity  . Worried About 04/28/20 in the Last Year: Never true  . Ran Out of Food in the Last Year: Never true  Transportation Needs: No Transportation Needs  . Lack of Transportation (Medical): No  . Lack of Transportation (Non-Medical): No  Physical Activity: Inactive  . Days of Exercise per Week: 0 days  . Minutes of Exercise per Session: 0 min  Stress: No Stress Concern Present  . Feeling of Stress : Not at all  Social Connections: Moderately Isolated  . Frequency of Communication with Friends and Family: Twice a week  . Frequency of Social Gatherings with Friends and Family: Twice a week  . Attends Religious Services: Never  . Active Member of Clubs or Organizations: No  . Attends Programme researcher, broadcasting/film/video Meetings: Never  . Marital Status: Married    Family History: Family History  Problem Relation Age of Onset  . Cancer Paternal Grandmother   . Thyroid disease Paternal Grandmother   . Breast cancer Maternal Grandmother   . Cancer Maternal Uncle   . ADD / ADHD Brother   .  Diabetes Maternal Grandfather   . Thyroid disease Paternal Aunt     Allergies: No Known Allergies  Medications Prior to Admission  Medication Sig Dispense Refill Last Dose  . prenatal vitamin w/FE, FA (NATACHEW) 29-1 MG CHEW chewable tablet Chew 1 tablet by mouth daily at 12 noon.   07/26/2020 at Unknown time  . Blood Pressure Monitor MISC For regular home bp monitoring during pregnancy 1 each 0      Review of Systems   All systems reviewed and negative except as stated in HPI  Temperature 98.2 F (36.8 C), temperature source Oral, weight 76.4 kg, last menstrual period 10/18/2019, SpO2 96 %. General appearance: alert, cooperative and moderate distress Lungs: clear to auscultation bilaterally Heart: regular rate and rhythm Abdomen: soft, non-tender; gravid Pelvic: as noted below Extremities: Homans sign is negative, no sign of DVT Presentation:  cephalic by exam Fetal monitoringBaseline: 120 bpm, Variability: Good {> 6 bpm), Accelerations: Reactive and Decelerations: Absent Uterine activityFrequency: Every 1 minutes Dilation: 10 Effacement (%): 100 Station: 0,Plus 1 Exam by:: Benedict Needy, RN   Prenatal labs: ABO, Rh: --/--/O POS (01/09 1751) Antibody: NEG (01/09 0443) Rubella: 2.06 (06/28 1607) RPR: Non Reactive (10/26 0831)  HBsAg: Negative (06/28 1607)  HIV: Non Reactive (10/26 0831)  GBS: Negative/-- (12/20 1500)  2 hr Glucola passed Genetic screening  normal Anatomy US normal  Prenatal Transfer Tool  Maternal Diabetes: No Genetic Screening: Normal Maternal Ultrasounds/Referrals: Normal Fetal Ultrasounds or other Referrals:  None Maternal Substance Abuse:  Yes:  Type: Smoker Significant Maternal Medications:  None Significant Maternal Lab Results: Group B Strep negative  Results for orders placed or performed during the hospital encounter of 07/27/20 (from the past 24 hour(s))  Type and screen [facility]   Collection Time: 07/27/20  4:43 AM  Result Value Ref Range   ABO/RH(D) O POS    Antibody Screen NEG    Sample Expiration      07/30/2020,2359 Performed at [facility], [address] [place], [place] [address]   POCT fern test   Collection Time: 07/27/20  4:53 AM  Result Value Ref Range   POCT Fern Test Positive = ruptured amniotic membanes   CBC   Collection Time: 07/27/20  5:00 AM  Result Value Ref Range   WBC 17.0 (H) 4.0 - 10.5 K/uL   RBC 4.66 3.87 - 5.11 MIL/uL   Hemoglobin 13.2 12.0 - 15.0 g/dL   HCT 27.7 82.4 - 23.5 %   MCV 86.5 80.0 - 100.0 fL   MCH 28.3 26.0 - 34.0 pg   MCHC 32.8 30.0 - 36.0 g/dL   RDW 36.1 44.3 - 15.4 %   Platelets 331 150 - 400 K/uL   nRBC 0.0 0.0 - 0.2 %    Patient Active Problem List   Diagnosis Date Noted  . Normal labor 07/27/2020  . Encounter for supervision of normal pregnancy, antepartum 01/14/2020  . Smoker 01/14/2020  .  Depression with anxiety 01/14/2020  . Near syncope 08/31/2013  . Scoliosis 03/03/2011    Assessment/Plan:  Scotty Pinder is a 25 y.o. G2P1001 at [redacted]w[redacted]d here for SOL.  #Labor: SROM @0200 . Patient progressed from 6cm-->10cm upon presentation to L&D. Forebag noted on exam. Continue expectant management. #Pain: PRN #FWB: Cat 1 #ID: GBS neg #MOF: breast #MOC: post placental liletta, risks/benefits discussed. Consents signed. Ordered. #Circ:  Yes #Anxiety/depression/intermittent PNC: SW consult postpartum.  , MD  07/27/2020, 5:48 AM

## 2020-07-27 NOTE — MAU Note (Signed)
..  Tammy Parks is a 25 y.o. at [redacted]w[redacted]d here in MAU reporting: Contractions since 0045 and LOF since 0000. +FM. Contractions are every 2-3 minutes. Denies vaginal bleeding.  Pain score: 10/10

## 2020-07-27 NOTE — Progress Notes (Signed)
CSW received consult for hx of Anxiety and Depression.  CSW met with MOB to offer support and complete assessment.    CSW congratulated MOB and FOB On the birth of infant. CSW advised MOB of the HIPPA policy in which MOB was agreeable to having FOB step out of room while CSW spoke with her. MOB was then advised by CSW of CSW's role and the reason for CSW coming to speak with her. MOB expressed that she was diagnosed with anxiety and depression about a year and a half ago.MOB expressed that she was on medication previously but reported that she stopped taking medications with no plans to restart at this time. MOB indicated that she also feels that her anxiety and depression came as a result of her being at home with her daughter for two years after she was born. MOB expressed no current concerns with anxiety or depression and expressed that she has no desire for therapy resources at this time when asked. MOB expressed no other known mental health hx and denies SI. HI and DV when asked by CSW.  CSW inquired from [facility] on her supports in which MOB identified  spouse as her primary support. MOB expressed that they have all needed items to care for infant with plans for infant to sleep in crib once arrived home. MOB expressed that infant would be seen at [facility] in [place] for further care.   CSW provided education regarding the baby blues period vs. perinatal mood disorders, discussed treatment and gave resources for mental health follow up if concerns arise.  CSW recommends self-evaluation during the postpartum time period using the New Mom Checklist from Postpartum Progress and encouraged MOB to contact a medical professional if symptoms are noted at any time.   CSW provided review of Sudden Infant Death Syndrome (SIDS) precautions.   CSW identifies no further need for intervention and no barriers to discharge at this time.   Tammy Parks, MSW, LCSW [facility] [place] [facility] [place] [phone]

## 2020-07-28 ENCOUNTER — Other Ambulatory Visit: Payer: Medicaid Other | Admitting: Women's Health

## 2020-07-28 MED ORDER — COCONUT OIL OIL: 1.0000 "application " | TOPICAL_OIL | 0 refills | Status: DC | PRN

## 2020-07-28 MED ORDER — IBUPROFEN 600 MG PO TABS: 600.0000 mg | ORAL_TABLET | Freq: Four times a day (QID) | ORAL | 0 refills | Status: DC

## 2020-07-28 MED ORDER — ACETAMINOPHEN 500 MG PO TABS: 1000.0000 mg | ORAL_TABLET | ORAL | Status: DC | PRN

## 2020-07-28 NOTE — Discharge Instructions (Signed)
-take tylenol 1000 mg every 6 hours as needed for pain, alternate with ibuprofen 600 mg every 6 hours -drink plenty of water to help with breastfeeding -continue prenatal vitamins while you are breastfeeding  Levonorgestrel intrauterine device (IUD) What is this medicine? LEVONORGESTREL IUD (LEE voe nor jes trel) is a contraceptive (birth control) device. The device is placed inside the uterus by a healthcare professional. It is used to prevent pregnancy. This device can also be used to treat heavy bleeding that occurs during your period. This medicine may be used for other purposes; ask your health care provider or pharmacist if you have questions. COMMON BRAND NAME(S): Cameron Ali What should I tell my health care provider before I take this medicine? They need to know if you have any of these conditions:  abnormal Pap smear  cancer of the breast, uterus, or cervix  diabetes  endometritis  genital or pelvic infection now or in the past  have more than one sexual partner or your partner has more than one partner  heart disease  history of an ectopic or tubal pregnancy  immune system problems  IUD in place  liver disease or tumor  problems with blood clots or take blood-thinners  seizures  use intravenous drugs  uterus of unusual shape  vaginal bleeding that has not been explained  an unusual or allergic reaction to levonorgestrel, other hormones, silicone, or polyethylene, medicines, foods, dyes, or preservatives  pregnant or trying to get pregnant  breast-feeding How should I use this medicine? This device is placed inside the uterus by a health care professional. Talk to your pediatrician regarding the use of this medicine in children. Special care may be needed. Overdosage: If you think you have taken too much of this medicine contact a poison control center or emergency room at once. NOTE: This medicine is only for you. Do not share this  medicine with others. What if I miss a dose? This does not apply. Depending on the brand of device you have inserted, the device will need to be replaced every 3 to 6 years if you wish to continue using this type of birth control. What may interact with this medicine? Do not take this medicine with any of the following medications:  amprenavir  bosentan  fosamprenavir This medicine may also interact with the following medications:  aprepitant  armodafinil  barbiturate medicines for inducing sleep or treating seizures  bexarotene  boceprevir  griseofulvin  medicines to treat seizures like carbamazepine, ethotoin, felbamate, oxcarbazepine, phenytoin, topiramate  modafinil  pioglitazone  rifabutin  rifampin  rifapentine  some medicines to treat HIV infection like atazanavir, efavirenz, indinavir, lopinavir, nelfinavir, tipranavir, ritonavir  St. John's wort  warfarin This list may not describe all possible interactions. Give your health care provider a list of all the medicines, herbs, non-prescription drugs, or dietary supplements you use. Also tell them if you smoke, drink alcohol, or use illegal drugs. Some items may interact with your medicine. What should I watch for while using this medicine? Visit your doctor or health care professional for regular check ups. See your doctor if you or your partner has sexual contact with others, becomes HIV positive, or gets a sexual transmitted disease. This product does not protect you against HIV infection (AIDS) or other sexually transmitted diseases. You can check the placement of the IUD yourself by reaching up to the top of your vagina with clean fingers to feel the threads. Do not pull on the threads. It  is a good habit to check placement after each menstrual period. Call your doctor right away if you feel more of the IUD than just the threads or if you cannot feel the threads at all. The IUD may come out by itself. You may  become pregnant if the device comes out. If you notice that the IUD has come out use a backup birth control method like condoms and call your health care provider. Using tampons will not change the position of the IUD and are okay to use during your period. This IUD can be safely scanned with magnetic resonance imaging (MRI) only under specific conditions. Before you have an MRI, tell your healthcare provider that you have an IUD in place, and which type of IUD you have in place. What side effects may I notice from receiving this medicine? Side effects that you should report to your doctor or health care professional as soon as possible:  allergic reactions like skin rash, itching or hives, swelling of the face, lips, or tongue  fever, flu-like symptoms  genital sores  high blood pressure  no menstrual period for 6 weeks during use  pain, swelling, warmth in the leg  pelvic pain or tenderness  severe or sudden headache  signs of pregnancy  stomach cramping  sudden shortness of breath  trouble with balance, talking, or walking  unusual vaginal bleeding, discharge  yellowing of the eyes or skin Side effects that usually do not require medical attention (report to your doctor or health care professional if they continue or are bothersome):  acne  breast pain  change in sex drive or performance  changes in weight  cramping, dizziness, or faintness while the device is being inserted  headache  irregular menstrual bleeding within first 3 to 6 months of use  nausea This list may not describe all possible side effects. Call your doctor for medical advice about side effects. You may report side effects to FDA at 1-800-FDA-1088. Where should I keep my medicine? This does not apply. NOTE: This sheet is a summary. It may not cover all possible information. If you have questions about this medicine, talk to your doctor, pharmacist, or health care provider.  2020  Elsevier/Gold Standard (2018-05-16 13:22:01)   Postpartum Care After Vaginal Delivery This sheet gives you information about how to care for yourself from the time you deliver your baby to up to 6-12 weeks after delivery (postpartum period). Your health care provider may also give you more specific instructions. If you have problems or questions, contact your health care provider. Follow these instructions at home: Vaginal bleeding  It is normal to have vaginal bleeding (lochia) after delivery. Wear a sanitary pad for vaginal bleeding and discharge. ? During the first week after delivery, the amount and appearance of lochia is often similar to a menstrual period. ? Over the next few weeks, it will gradually decrease to a dry, yellow-brown discharge. ? For most women, lochia stops completely by 4-6 weeks after delivery. Vaginal bleeding can vary from woman to woman.  Change your sanitary pads frequently. Watch for any changes in your flow, such as: ? A sudden increase in volume. ? A change in color. ? Large blood clots.  If you pass a blood clot from your vagina, save it and call your health care provider to discuss. Do not flush blood clots down the toilet before talking with your health care provider.  Do not use tampons or douches until your health care provider says  this is safe.  If you are not breastfeeding, your period should return 6-8 weeks after delivery. If you are feeding your child breast milk only (exclusive breastfeeding), your period may not return until you stop breastfeeding. Perineal care  Keep the area between the vagina and the anus (perineum) clean and dry as told by your health care provider. Use medicated pads and pain-relieving sprays and creams as directed.  If you had a cut in the perineum (episiotomy) or a tear in the vagina, check the area for signs of infection until you are healed. Check for: ? More redness, swelling, or pain. ? Fluid or blood coming from  the cut or tear. ? Warmth. ? Pus or a bad smell.  You may be given a squirt bottle to use instead of wiping to clean the perineum area after you go to the bathroom. As you start healing, you may use the squirt bottle before wiping yourself. Make sure to wipe gently.  To relieve pain caused by an episiotomy, a tear in the vagina, or swollen veins in the anus (hemorrhoids), try taking a warm sitz bath 2-3 times a day. A sitz bath is a warm water bath that is taken while you are sitting down. The water should only come up to your hips and should cover your buttocks. Breast care  Within the first few days after delivery, your breasts may feel heavy, full, and uncomfortable (breast engorgement). Milk may also leak from your breasts. Your health care provider can suggest ways to help relieve the discomfort. Breast engorgement should go away within a few days.  If you are breastfeeding: ? Wear a bra that supports your breasts and fits you well. ? Keep your nipples clean and dry. Apply creams and ointments as told by your health care provider. ? You may need to use breast pads to absorb milk that leaks from your breasts. ? You may have uterine contractions every time you breastfeed for up to several weeks after delivery. Uterine contractions help your uterus return to its normal size. ? If you have any problems with breastfeeding, work with your health care provider or Advertising copywriterlactation consultant.  If you are not breastfeeding: ? Avoid touching your breasts a lot. Doing this can make your breasts produce more milk. ? Wear a good-fitting bra and use cold packs to help with swelling. ? Do not squeeze out (express) milk. This causes you to make more milk. Intimacy and sexuality  Ask your health care provider when you can engage in sexual activity. This may depend on: ? Your risk of infection. ? How fast you are healing. ? Your comfort and desire to engage in sexual activity.  You are able to get pregnant  after delivery, even if you have not had your period. If desired, talk with your health care provider about methods of birth control (contraception). Medicines  Take over-the-counter and prescription medicines only as told by your health care provider.  If you were prescribed an antibiotic medicine, take it as told by your health care provider. Do not stop taking the antibiotic even if you start to feel better. Activity  Gradually return to your normal activities as told by your health care provider. Ask your health care provider what activities are safe for you.  Rest as much as possible. Try to rest or take a nap while your baby is sleeping. Eating and drinking   Drink enough fluid to keep your urine pale yellow.  Eat high-fiber foods every day.  These may help prevent or relieve constipation. High-fiber foods include: ? Whole grain cereals and breads. ? Brown rice. ? Beans. ? Fresh fruits and vegetables.  Do not try to lose weight quickly by cutting back on calories.  Take your prenatal vitamins until your postpartum checkup or until your health care provider tells you it is okay to stop. Lifestyle  Do not use any products that contain nicotine or tobacco, such as cigarettes and e-cigarettes. If you need help quitting, ask your health care provider.  Do not drink alcohol, especially if you are breastfeeding. General instructions  Keep all follow-up visits for you and your baby as told by your health care provider. Most women visit their health care provider for a postpartum checkup within the first 3-6 weeks after delivery. Contact a health care provider if:  You feel unable to cope with the changes that your child brings to your life, and these feelings do not go away.  You feel unusually sad or worried.  Your breasts become red, painful, or hard.  You have a fever.  You have trouble holding urine or keeping urine from leaking.  You have little or no interest in  activities you used to enjoy.  You have not breastfed at all and you have not had a menstrual period for 12 weeks after delivery.  You have stopped breastfeeding and you have not had a menstrual period for 12 weeks after you stopped breastfeeding.  You have questions about caring for yourself or your baby.  You pass a blood clot from your vagina. Get help right away if:  You have chest pain.  You have difficulty breathing.  You have sudden, severe leg pain.  You have severe pain or cramping in your lower abdomen.  You bleed from your vagina so much that you fill more than one sanitary pad in one hour. Bleeding should not be heavier than your heaviest period.  You develop a severe headache.  You faint.  You have blurred vision or spots in your vision.  You have bad-smelling vaginal discharge.  You have thoughts about hurting yourself or your baby. If you ever feel like you may hurt yourself or others, or have thoughts about taking your own life, get help right away. You can go to the nearest emergency department or call:  Your local emergency services (911 in the [place]  A suicide crisis helpline, such as the National Suicide Prevention Lifeline at 870 853 8105. This is open 24 hours a day. Summary  The period of time right after you deliver your newborn up to 6-12 weeks after delivery is called the postpartum period.  Gradually return to your normal activities as told by your health care provider.  Keep all follow-up visits for you and your baby as told by your health care provider. This information is not intended to replace advice given to you by your health care provider. Make sure you discuss any questions you have with your health care provider. Document Revised: 07/08/2017 Document Reviewed: 04/18/2017 Elsevier Patient Education  2020 [employer].

## 2020-08-08 ENCOUNTER — Encounter: Payer: Self-pay | Admitting: *Deleted

## 2020-09-01 ENCOUNTER — Other Ambulatory Visit: Payer: Self-pay

## 2020-09-01 ENCOUNTER — Encounter: Payer: Self-pay | Admitting: Women's Health

## 2020-09-01 ENCOUNTER — Ambulatory Visit (INDEPENDENT_AMBULATORY_CARE_PROVIDER_SITE_OTHER): Payer: Medicaid Other | Admitting: Women's Health

## 2020-09-01 NOTE — Patient Instructions (Addendum)
NO SEX UNTIL AFTER YOU GET YOUR BIRTH CONTROL  Tips To Increase Milk Supply  Lots of water! Enough so that your urine is clear  Plenty of calories, if you're not getting enough calories, your milk supply can decrease  Breastfeed/pump often, every 2-3 hours x 20-30mins  Fenugreek 3 pills 3 times a day, this may make your urine smell like maple syrup  Mother's Milk Tea  Lactation cookies, google for the recipe  Real oatmeal  Body Armor sports drinks  Greater Than hydration drink   

## 2020-09-01 NOTE — Progress Notes (Signed)
POSTPARTUM VISIT Patient name: Tammy Parks MRN [id number]  Date of birth: November 07, 1995 Chief Complaint:   Postpartum Care  History of Present Illness:   Tammy Parks is a 25 y.o. G48P2002 Caucasian female being seen today for a postpartum visit. She is 5 weeks postpartum following a spontaneous vaginal delivery at 40.3 gestational weeks. IOL: No, for n/a. Anesthesia: none.  Laceration: 1st degree.  Complications: none. Inpatient contraception: yes pp Liletta placed 07/27/20.   Pregnancy uncomplicated. Tobacco use: yes. Substance use disorder: no. Last pap smear: 12/25/19 and results were NILM w/ HRHPV not done. Next pap smear due: 2024 No LMP recorded.  Postpartum course has been complicated by Feels something poking inside since after delivery in hospital, pain w/ sex. Green discharge, no itching/odor/irritation. Bleeding no bleeding. Bowel function is normal. Bladder function is normal. Urinary incontinence? No, fecal incontinence? No Patient is sexually active. Last sexual activity: 3d ago, had to stop b/c of pain.  Desired contraception: PP IUD placed. Patient does not want a pregnancy in the future.   Upstream - 09/01/20 1413      Pregnancy Intention Screening   Does the patient want to become pregnant in the next year? No    Does the patient's partner want to become pregnant in the next year? No    Would the patient like to discuss contraceptive options today? No      Contraception Wrap Up   Current Method IUD or IUS    End Method IUD or IUS    Contraception Counseling Provided No          The pregnancy intention screening data noted above was reviewed. Potential methods of contraception were discussed. The patient elected to proceed with Abstinence.   Edinburgh Postpartum Depression Screening: Negative  Edinburgh Postnatal Depression Scale - 09/01/20 1414      Edinburgh Postnatal Depression Scale:  In the Past 7 Days   I have been able to laugh and see the  funny side of things. 0    I have looked forward with enjoyment to things. 0    I have blamed myself unnecessarily when things went wrong. 1    I have been anxious or worried for no good reason. 1    I have felt scared or panicky for no good reason. 2    Things have been getting on top of me. 1    I have been so unhappy that I have had difficulty sleeping. 0    I have felt sad or miserable. 1    I have been so unhappy that I have been crying. 0    The thought of harming myself has occurred to me. 0    Edinburgh Postnatal Depression Scale Total 6          Baby's course has been uncomplicated. Baby is feeding by breast: milk supply inadequate . Infant has a pediatrician/family doctor? Yes.  Pt has material needs met for her and baby: Yes.   Review of Systems:   Pertinent items are noted in HPI Denies Abnormal vaginal discharge w/ itching/odor/irritation, headaches, visual changes, shortness of breath, chest pain, abdominal pain, severe nausea/vomiting, or problems with urination or bowel movements. Pertinent History Reviewed:  Reviewed past medical,surgical, obstetrical and family history.  Reviewed problem list, medications and allergies. OB History  Gravida Para Term Preterm AB Living  2 2 2  0 0 2  SAB IAB Ectopic Multiple Live Births  0 0 0 0 2    #  Outcome Date GA Lbr Len/2nd Weight Sex Delivery Anes PTL Lv  2 Term 07/27/20 [id number] / 00:48 7 lb 12.7 oz (3.535 kg) M Vag-Spont None  LIV     Birth Comments: none  1 Term 06/29/17 [id number]:40 / 08:24 7 lb 2.5 oz (3.245 kg) F Vag-Vacuum EPI  LIV   Physical Assessment:   Vitals:   09/01/20 1416  BP: 129/67  Pulse: 78  Weight: 141 lb 6.4 oz (64.1 kg)  Height: 5' 2"  (1.575 m)  Body mass index is 25.86 kg/m.       Physical Examination:   General appearance: alert, well appearing, and in no distress  Mental status: alert, oriented to person, place, and time  Skin: warm & dry   Cardiovascular: normal heart rate noted    Respiratory: normal respiratory effort, no distress   Breasts: deferred, no complaints   Abdomen: soft, non-tender   Pelvic: IUD above cervix in vaginal vault (in anterior fornix), removed  Rectal: no hemorrhoids  Extremities: no edema  Chaperone: Dr. PaPosey Pronto       No results found for this or any previous visit (from the past 24 hour(s)).  Assessment & Plan:  1) Postpartum exam 2) 5 wks s/p spontaneous vaginal delivery 3) breast & bottle feeding> gave tips to increase milk supply 4) Depression screening 5) Contraception> IUD not in uterus, suspect it may have been placed intravaginally at delivery or immediate expulsion, as pt states she has felt the poking sensation immediately in hospital. Wants another, had sex 3d ago, abstinence, then return for reinsertion in 14d w/ UPT.   Essential components of care per ACOG recommendations:  1.  Mood and well being:  . If positive depression screen, discussed and plan developed.  . If using tobacco we discussed reduction/cessation and risk of relapse . If current substance abuse, we discussed and referral to local resources was offered.   2. Infant care and feeding:  . If breastfeeding, discussed returning to work, pumping, breastfeeding-associated pain, guidance regarding return to fertility while lactating if not using another method. If needed, patient was provided with a letter to be allowed to pump q 2-3hrs to support lactation in a private location with access to a refrigerator to store breastmilk.   . Recommended that all caregivers be immunized for flu, pertussis and other preventable communicable diseases . If pt does not have material needs met for her/baby, referred to local resources for help obtaining these.  3. Sexuality, contraception and birth spacing . Provided guidance regarding sexuality, management of dyspareunia, and resumption of intercourse . Discussed avoiding interpregnancy interval <61m43m and recommended birth  spacing of 18 months  4. Sleep and fatigue . Discussed coping options for fatigue and sleep disruption . Encouraged family/partner/community support of 4 hrs of uninterrupted sleep to help with mood and fatigue  5. Physical recovery  . If pt had a C/S, assessed incisional pain and providing guidance on normal vs prolonged recovery . If pt had a laceration, perineal healing and pain reviewed.  . If urinary or fecal incontinence, discussed management and referred to PT or uro/gyn if indicated  . Patient is safe to resume physical activity. Discussed attainment of healthy weight.  6.  Chronic disease management . Discussed pregnancy complications if any, and their implications for future childbearing and long-term maternal health. . Review recommendations for prevention of recurrent pregnancy complications, such as 17 hydroxyprogesterone caproate to reduce risk for recurrent PTB not applicable, or aspirin to reduce risk  of preeclampsia not applicable. . Pt had GDM: No. If yes, 2hr GTT scheduled: not applicable. . Reviewed medications and non-pregnant dosing including consideration of whether pt is breastfeeding using a reliable resource such as LactMed: not applicable . Referred for f/u w/ PCP or subspecialist providers as indicated: not applicable  7. Health maintenance . Mammogram at 27yo or earlier if indicated . Pap smears as indicated  Meds: No orders of the defined types were placed in this encounter.   Follow-up: Return in about 11 days (around 09/12/2020) for IUD insertion.   No orders of the defined types were placed in this encounter.   [place], [facility] 09/01/2020 2:45 PM

## 2020-09-15 ENCOUNTER — Other Ambulatory Visit: Payer: Self-pay

## 2020-09-15 ENCOUNTER — Encounter: Payer: Self-pay | Admitting: Women's Health

## 2020-09-15 ENCOUNTER — Ambulatory Visit (INDEPENDENT_AMBULATORY_CARE_PROVIDER_SITE_OTHER): Payer: Medicaid Other | Admitting: Women's Health

## 2020-09-15 VITALS — BP 103/62 | HR 92 | Wt 139.2 lb

## 2020-09-15 DIAGNOSIS — Z975 Presence of (intrauterine) contraceptive device: Secondary | ICD-10-CM | POA: Diagnosis not present

## 2020-09-15 DIAGNOSIS — Z30430 Encounter for insertion of intrauterine contraceptive device: Secondary | ICD-10-CM | POA: Diagnosis not present

## 2020-09-15 LAB — POCT URINE PREGNANCY: Preg Test, Ur: NEGATIVE

## 2020-09-15 MED ORDER — LEVONORGESTREL 19.5 MCG/DAY IU IUD: INTRAUTERINE_SYSTEM | Freq: Once | INTRAUTERINE | Status: DC

## 2020-09-15 NOTE — Patient Instructions (Signed)
 Nothing in vagina for 3 days (no sex, douching, tampons, etc...)  Check your strings once a month to make sure you can feel them, if you are not able to please let us know  If you develop a fever of 100.4 or more in the next few weeks, or if you develop severe abdominal pain, please let us know  Use a backup method of birth control, such as condoms, for 2 weeks     Intrauterine Device Insertion, Care After This sheet gives you information about how to care for yourself after your procedure. Your health care provider may also give you more specific instructions. If you have problems or questions, contact your health care provider. What can I expect after the procedure? After the procedure, it is common to have:  Cramps and pain in the abdomen.  Bleeding. It may be light or heavy. This may last for a few days.  Lower back pain.  Dizziness.  Headaches.  Nausea. Follow these instructions at home:  Before resuming sexual activity, check to make sure that you can feel the IUD string or strings. You should be able to feel the end of the string below the opening of your cervix. If your IUD string is in place, you may resume sexual activity. ? If you had a hormonal IUD inserted more than 7 days after your most recent period started, you will need to use a backup method of birth control for 7 days after IUD insertion. Ask your health care provider whether this applies to you.  Continue to check that the IUD is still in place by feeling for the strings after every menstrual period, or once a month.  An IUD will not protect you from sexually transmitted infections (STIs). Use methods to prevent the exchange of body fluids between partners (barrier protection) every time you have sex. Barrier protection can be used during oral, vaginal, or anal sex. Commonly used barrier methods include: ? Female condom. ? Female condom. ? Dental dam.  Take over-the-counter and prescription medicines only as  told by your health care provider.  Keep all follow-up visits as told by your health care provider. This is important.   Contact a health care provider if:  You feel light-headed or weak.  You have any of the following problems with your IUD string or strings: ? The string bothers or hurts you or your sexual partner. ? You cannot feel the string. ? The string has gotten longer.  You can feel the IUD in your vagina.  You think you may be pregnant, or you miss your menstrual period.  You think you may have a sexually transmitted infection (STI). Get help right away if:  You have flu-like symptoms, such as tiredness (fatigue) and muscle aches.  You have a fever and chills.  You have bleeding that is heavier or lasts longer than a normal menstrual cycle.  You have abnormal or bad-smelling discharge from your vagina.  You develop abdominal pain that is new, is getting worse, or is not in the same area of earlier cramping and pain.  You have pain during sexual activity. Summary  After the procedure, it is common to have cramps and pain in the abdomen. It is also common to have light bleeding or heavier bleeding that is like your menstrual period.  Continue to check that the IUD is still in place by feeling for the strings after every menstrual period, or once a month.  Keep all follow-up visits as   told by your health care provider. This is important.  Contact your health care provider if you have problems with your IUD strings, such as the string getting longer or bothering you or your sexual partner. This information is not intended to replace advice given to you by your health care provider. Make sure you discuss any questions you have with your health care provider. Document Revised: 06/26/2019 Document Reviewed: 06/26/2019 Elsevier Patient Education  2021 [employer] Inc.  

## 2020-09-15 NOTE — Progress Notes (Signed)
   IUD INSERTION Patient name: Tammy Parks MRN [id number]  Date of birth: 30-Oct-1995 Subjective Findings:   Tammy Parks is a 25 y.o. G18P2002 Caucasian female being seen today for insertion of a Liletta IUD.  Depression screen [facility] 2/9 05/13/2020 01/14/2020 09/13/2017 04/20/2017 11/17/2016  Decreased Interest 0 0 0 0 3  Down, Depressed, Hopeless 0 0 0 0 0  PHQ - 2 Score 0 0 0 0 3  Altered sleeping 0 2 - - 3  Tired, decreased energy 0 2 - - 3  Change in appetite 0 0 - - 3  Feeling bad or failure about yourself  0 0 - - 0  Trouble concentrating 0 0 - - 0  Moving slowly or fidgety/restless 0 0 - - 0  Suicidal thoughts 0 0 - - 0  PHQ-9 Score 0 4 - - 12  Difficult doing work/chores - - - - -    No LMP recorded. Last sexual intercourse was >2wks ago Last pap6/28/21. Results were: NILM w/ HRHPV not done  The risks and benefits of the method and placement have been thouroughly reviewed with the patient and all questions were answered.  Specifically the patient is aware of failure rate of 07/998, expulsion of the IUD and of possible perforation.  The patient is aware of irregular bleeding due to the method and understands the incidence of irregular bleeding diminishes with time.  Signed copy of informed consent in chart.  Pertinent History Reviewed:   Reviewed past medical,surgical, social, obstetrical and family history.  Reviewed problem list, medications and allergies. Objective Findings & Procedure:   Vitals:   09/15/20 1549  BP: 103/62  Pulse: 92  Weight: 139 lb 3.2 oz (63.1 kg)  Body mass index is 25.46 kg/m.  Results for orders placed or performed in visit on 09/15/20 (from the past 24 hour(s))  POCT urine pregnancy   Collection Time: 09/15/20  3:53 PM  Result Value Ref Range   Preg Test, Ur Negative Negative     Time out was performed.  A graves speculum was placed in the vagina.  The cervix was visualized, prepped using Betadine, and grasped with a single  tooth tenaculum. The uterus was found to be anteroflexed and it sounded to 7 cm.  Liletta  IUD placed per manufacturer's recommendations. The strings were trimmed to approximately 3 cm. The patient tolerated the procedure well.   Informal transvaginal sonogram was performed and uterus very anteflexed and IUD appears to be w/in myometrium-didn't completely make turn to go to fundus Confirmed by [facility], ultrasonographer So, IUD removed w/o difficulty. Pt does want to schedule to try again, husband is off all next week, so wants to come next week.   Chaperone: Albertine Grates, SNP   Assessment & Plan:   1) Failed Liletta IUD insertion> wants to try again, abstinence until reinsertion, schedule next week (husband off) w/ MD only   Orders Placed This Encounter  Procedures  . POCT urine pregnancy    Return for next week for IUD insertion w/ MD only.  Cheral Marker CNM, [facility] 09/15/2020 4:32 PM

## 2020-09-29 ENCOUNTER — Ambulatory Visit: Payer: Medicaid Other | Admitting: Obstetrics & Gynecology

## 2020-10-29 ENCOUNTER — Encounter: Payer: Medicaid Other | Admitting: Women's Health

## 2021-08-31 ENCOUNTER — Ambulatory Visit (INDEPENDENT_AMBULATORY_CARE_PROVIDER_SITE_OTHER): Payer: Commercial Managed Care - PPO | Admitting: Adult Health

## 2021-08-31 ENCOUNTER — Other Ambulatory Visit: Payer: Self-pay

## 2021-08-31 ENCOUNTER — Encounter: Payer: Self-pay | Admitting: Adult Health

## 2021-08-31 VITALS — BP 118/72 | HR 67 | Ht 62.0 in | Wt 129.5 lb

## 2021-08-31 DIAGNOSIS — Z30017 Encounter for initial prescription of implantable subdermal contraceptive: Secondary | ICD-10-CM | POA: Diagnosis not present

## 2021-08-31 DIAGNOSIS — Z3202 Encounter for pregnancy test, result negative: Secondary | ICD-10-CM

## 2021-08-31 LAB — POCT URINE PREGNANCY: Preg Test, Ur: NEGATIVE

## 2021-08-31 MED ORDER — ETONOGESTREL 68 MG ~~LOC~~ IMPL
68.0000 mg | DRUG_IMPLANT | Freq: Once | SUBCUTANEOUS | Status: AC
  Administered 2021-08-31: 68 mg via SUBCUTANEOUS

## 2021-08-31 NOTE — Progress Notes (Addendum)
°  Subjective:     Patient ID: Tammy Parks, female   DOB: [birth year], 26 y.o.   MRN: [id number]  HPI Tammy Parks is a 26 year old white female,married, G2P2 in for nexplanon insertion. She has had in the past.  Lab Results  Component Value Date   DIAGPAP  01/14/2020    - Negative for intraepithelial lesion or malignancy (NILM)    Review of Systems For nexplanon insertion Reviewed past medical,surgical, social and family history. Reviewed medications and allergies.     Objective:   Physical Exam BP 118/72 (BP Location: Right Arm, Patient Position: Sitting, Cuff Size: Normal)    Pulse 67    Ht 5\' 2"  (1.575 m)    Wt 129 lb 8 oz (58.7 kg)    LMP 08/11/2021    Breastfeeding No    BMI 23.69 kg/m  UPT is negative. Last sex before period.  Consent signed, time out called.  Right arm cleansed with betadine, and injected with 1.5 cc 2% lidocaine and waited til numb. Nexplanon easily inserted and steri strips applied.Rod easily palpated by provider and pt. Pressure dressing applied.    Fall risk is low Depression screen [facility] 2/9 08/31/2021 05/13/2020 01/14/2020  Decreased Interest 0 0 0  Down, Depressed, Hopeless 0 0 0  PHQ - 2 Score 0 0 0  Altered sleeping - 0 2  Tired, decreased energy - 0 2  Change in appetite - 0 0  Feeling bad or failure about yourself  - 0 0  Trouble concentrating - 0 0  Moving slowly or fidgety/restless - 0 0  Suicidal thoughts - 0 0  PHQ-9 Score - 0 4  Difficult doing work/chores - - -     Upstream - 08/31/21 1132       Pregnancy Intention Screening   Does the patient want to become pregnant in the next year? No    Does the patient's partner want to become pregnant in the next year? No    Would the patient like to discuss contraceptive options today? Yes      Contraception Wrap Up   Current Method Female Condom    End Method Hormonal Implant    Contraception Counseling Provided Yes             Assessment:     1. Nexplanon insertion Lot  [id number] Exp [id number] Use condoms x 2 weeks, keep clean and dry x 24 hours, no heavy lifting, keep steri strips on x 72 hours, Keep pressure dressing on x 24 hours. Follow up prn problems.   2. Negative pregnancy test     Plan:     Remove in 3 years or sooner if desired.

## 2021-08-31 NOTE — Patient Instructions (Signed)
Use condoms x 2 weeks, keep clean and dry x 24 hours, no heavy lifting, keep steri strips on x 72 hours, Keep pressure dressing on x 24 hours. Follow up prn problems.  

## 2021-12-22 ENCOUNTER — Encounter: Payer: Self-pay | Admitting: *Deleted

## 2022-12-29 ENCOUNTER — Ambulatory Visit (INDEPENDENT_AMBULATORY_CARE_PROVIDER_SITE_OTHER): Payer: Commercial Managed Care - PPO | Admitting: Family Medicine

## 2022-12-29 ENCOUNTER — Encounter: Payer: Self-pay | Admitting: Family Medicine

## 2022-12-29 VITALS — BP 126/82 | HR 98 | Ht 62.0 in | Wt 142.1 lb

## 2022-12-29 DIAGNOSIS — E7849 Other hyperlipidemia: Secondary | ICD-10-CM

## 2022-12-29 DIAGNOSIS — R7301 Impaired fasting glucose: Secondary | ICD-10-CM

## 2022-12-29 DIAGNOSIS — M7989 Other specified soft tissue disorders: Secondary | ICD-10-CM

## 2022-12-29 DIAGNOSIS — R0602 Shortness of breath: Secondary | ICD-10-CM

## 2022-12-29 DIAGNOSIS — Z23 Encounter for immunization: Secondary | ICD-10-CM

## 2022-12-29 DIAGNOSIS — E0789 Other specified disorders of thyroid: Secondary | ICD-10-CM | POA: Diagnosis not present

## 2022-12-29 DIAGNOSIS — M79661 Pain in right lower leg: Secondary | ICD-10-CM

## 2022-12-29 DIAGNOSIS — E559 Vitamin D deficiency, unspecified: Secondary | ICD-10-CM | POA: Diagnosis not present

## 2022-12-29 MED ORDER — ALBUTEROL SULFATE HFA 108 (90 BASE) MCG/ACT IN AERS
2.0000 | INHALATION_SPRAY | Freq: Four times a day (QID) | RESPIRATORY_TRACT | 2 refills | Status: AC | PRN
Start: 2022-12-29 — End: ?

## 2022-12-29 MED ORDER — FUROSEMIDE 20 MG PO TABS
20.0000 mg | ORAL_TABLET | Freq: Once | ORAL | 3 refills | Status: AC | PRN
Start: 2022-12-29 — End: ?

## 2022-12-29 NOTE — Progress Notes (Signed)
New Patient Office Visit   Subjective   Patient ID: Tammy Parks, female    DOB: [birth year]  Age: 27 y.o. MRN: [id number]  CC:  Chief Complaint  Patient presents with   Establish Care    New patient, pt would like to discuss right ankle and leg (calf) swollen for over a year.     HPI Tammy Parks 27 year old female, presents to establish care. She  has a past medical history of Anxiety, Depression, and Scoliosis.  Right Leg Pain  Right leg pain started one year ago. There was no injury mechanism. The pain is present in the right ankle and right leg. The quality of the pain is described as aching. The pain is at a severity of 6/10. The pain has been fluctuating since onset. Associated symptoms include an inability to bear weight, muscle weakness, numbness and tingling. Pertinent negatives include no loss of motion or loss of sensation. The symptoms are aggravated by movement. She has tried rest, non-weight bearing and elevation for the symptoms. The treatment provided no relief.        Outpatient Encounter Medications as of 12/29/2022  Medication Sig   albuterol (VENTOLIN HFA) 108 (90 Base) MCG/ACT inhaler Inhale 2 puffs into the lungs every 6 (six) hours as needed for wheezing or shortness of breath.   furosemide (LASIX) 20 MG tablet Take 1 tablet (20 mg total) by mouth once as needed for up to 1 dose for edema.   [DISCONTINUED] acetaminophen (TYLENOL) 500 MG tablet Take 2 tablets (1,000 mg total) by mouth every 4 (four) hours as needed (for pain scale < 4).   [DISCONTINUED] Blood Pressure Monitor MISC For regular home bp monitoring during pregnancy (Patient not taking: Reported on 08/31/2021)   [DISCONTINUED] coconut oil OIL Apply 1 application topically as needed. (Patient not taking: Reported on 08/31/2021)   [DISCONTINUED] ibuprofen (ADVIL) 600 MG tablet Take 1 tablet (600 mg total) by mouth every 6 (six) hours. (Patient not taking: Reported on 08/31/2021)    [DISCONTINUED] prenatal vitamin w/FE, FA (NATACHEW) 29-1 MG CHEW chewable tablet Chew 1 tablet by mouth daily at 12 noon. (Patient not taking: Reported on 08/31/2021)   No facility-administered encounter medications on file as of 12/29/2022.    Past Surgical History:  Procedure Laterality Date   DENTAL SURGERY     pt sates over the years   WISDOM TOOTH EXTRACTION      Review of Systems  Constitutional:  Negative for chills and fever.  HENT:  Negative for ear pain.   Eyes:  Negative for blurred vision.  Respiratory:  Positive for shortness of breath.   Cardiovascular:  Negative for chest pain.  Gastrointestinal:  Negative for abdominal pain.  Genitourinary:  Negative for dysuria.  Musculoskeletal:  Negative for myalgias.  Skin:  Negative for rash.  Neurological:  Positive for headaches.      Objective    BP 126/82   Pulse 98   Ht 5\' 2"  (1.575 m)   Wt 142 lb 1.3 oz (64.4 kg)   SpO2 98%   BMI 25.99 kg/m   Physical Exam Vitals reviewed.  Constitutional:      General: She is not in acute distress.    Appearance: Normal appearance. She is not ill-appearing, toxic-appearing or diaphoretic.  HENT:     Head: Normocephalic.  Eyes:     General:        Right eye: No discharge.        Left eye:  No discharge.     Conjunctiva/sclera: Conjunctivae normal.  Cardiovascular:     Rate and Rhythm: Normal rate.     Pulses: Normal pulses.     Heart sounds: Normal heart sounds.  Pulmonary:     Effort: Pulmonary effort is normal. No respiratory distress.     Breath sounds: Normal breath sounds.  Musculoskeletal:        General: Swelling and tenderness present. Normal range of motion.     Cervical back: Normal range of motion.     Right lower leg: Edema present.  Skin:    General: Skin is warm and dry.     Capillary Refill: Capillary refill takes less than 2 seconds.  Neurological:     General: No focal deficit present.     Mental Status: She is alert and oriented to person, place,  and time.     Coordination: Coordination normal.     Gait: Gait normal.  Psychiatric:        Mood and Affect: Mood normal.        Behavior: Behavior normal.       Assessment & Plan:  Vitamin D deficiency -     VITAMIN D 25 Hydroxy (Vit-D Deficiency, Fractures)  Impaired fasting blood sugar -     Hemoglobin A1c  Other specified disorders of thyroid -     TSH + free T4  Other hyperlipidemia -     CBC with Differential/Platelet -     CMP14+EGFR -     Lipid panel  Immunization due -     HPV 9-valent vaccine,Recombinat  Leg swelling -     Furosemide; Take 1 tablet (20 mg total) by mouth once as needed for up to 1 dose for edema.  Dispense: 30 tablet; Refill: 3 -     VAS [place] LOWER EXTREMITY ARTERIAL DUPLEX; Future -     VAS [place] LOWER EXTREMITY VENOUS (DVT); Future  SOB (shortness of breath) -     Albuterol Sulfate HFA; Inhale 2 puffs into the lungs every 6 (six) hours as needed for wheezing or shortness of breath.  Dispense: 8 g; Refill: 2  Pain and swelling of right lower leg Assessment & Plan: Right  calf ache on foot dosiflexion DVT vs Venous Insufficiently unknown etiology Ordered US Venous Doppler  and VAS [place] LOWER EXTREMITY ARTERIAL DUPLEX - Awaiting results will treat accordingly.  Advise patient to visit ED if notice any new alarming symptoms such as chest pain, shortness of breath, headaches, or dizziness. Discussed do not stand or sit in on spot for a long time, Avoid tight clothing that restricts blood flow in your legs. Increase fluid intake with water to avoid dehydration. Can take Lasix 20 mg PRN      Return in about 1 month (around 01/28/2023) for Pap smear.   Cruzita Lederer Newman Nip, FNP

## 2022-12-29 NOTE — Patient Instructions (Addendum)

## 2022-12-29 NOTE — Assessment & Plan Note (Addendum)
Right  calf ache on foot dosiflexion DVT vs Venous Insufficiently unknown etiology Ordered US Venous Doppler  and VAS [place] LOWER EXTREMITY ARTERIAL DUPLEX - Awaiting results will treat accordingly.  Advise patient to visit ED if notice any new alarming symptoms such as chest pain, shortness of breath, headaches, or dizziness. Discussed do not stand or sit in on spot for a long time, Avoid tight clothing that restricts blood flow in your legs. Increase fluid intake with water to avoid dehydration. Can take Lasix 20 mg PRN

## 2022-12-30 LAB — CBC WITH DIFFERENTIAL/PLATELET
Basophils Absolute: 0 10*3/uL (ref 0.0–0.2)
Basos: 0 %
EOS (ABSOLUTE): 0.1 10*3/uL (ref 0.0–0.4)
Eos: 1 %
Hematocrit: 42.5 % (ref 34.0–46.6)
Hemoglobin: 14 g/dL (ref 11.1–15.9)
Immature Grans (Abs): 0 10*3/uL (ref 0.0–0.1)
Immature Granulocytes: 0 %
Lymphocytes Absolute: 1.5 10*3/uL (ref 0.7–3.1)
Lymphs: 22 %
MCH: 30.8 pg (ref 26.6–33.0)
MCHC: 32.9 g/dL (ref 31.5–35.7)
MCV: 93 fL (ref 79–97)
Monocytes Absolute: 0.4 10*3/uL (ref 0.1–0.9)
Monocytes: 6 %
Neutrophils Absolute: 4.7 10*3/uL (ref 1.4–7.0)
Neutrophils: 71 %
Platelets: 308 10*3/uL (ref 150–450)
RBC: 4.55 x10E6/uL (ref 3.77–5.28)
RDW: 12.5 % (ref 11.7–15.4)
WBC: 6.7 10*3/uL (ref 3.4–10.8)

## 2022-12-30 LAB — VITAMIN D 25 HYDROXY (VIT D DEFICIENCY, FRACTURES): Vit D, 25-Hydroxy: 34.1 ng/mL (ref 30.0–100.0)

## 2022-12-30 LAB — CMP14+EGFR
ALT: 7 IU/L (ref 0–32)
AST: 13 IU/L (ref 0–40)
Albumin/Globulin Ratio: 2
Albumin: 4.5 g/dL (ref 4.0–5.0)
Alkaline Phosphatase: 83 IU/L (ref 44–121)
BUN/Creatinine Ratio: 11 (ref 9–23)
BUN: 9 mg/dL (ref 6–20)
Bilirubin Total: 0.6 mg/dL (ref 0.0–1.2)
CO2: 22 mmol/L (ref 20–29)
Calcium: 9.1 mg/dL (ref 8.7–10.2)
Chloride: 105 mmol/L (ref 96–106)
Creatinine, Ser: 0.8 mg/dL (ref 0.57–1.00)
Globulin, Total: 2.2 g/dL (ref 1.5–4.5)
Glucose: 76 mg/dL (ref 70–99)
Potassium: 4 mmol/L (ref 3.5–5.2)
Sodium: 139 mmol/L (ref 134–144)
Total Protein: 6.7 g/dL (ref 6.0–8.5)
eGFR: 103 mL/min/{1.73_m2} (ref >59)

## 2022-12-30 LAB — LIPID PANEL
Chol/HDL Ratio: 2.8 ratio (ref 0.0–4.4)
Cholesterol, Total: 170 mg/dL (ref 100–199)
HDL: 61 mg/dL (ref >39)
LDL Chol Calc (NIH): 98 mg/dL (ref 0–99)
Triglycerides: 53 mg/dL (ref 0–149)
VLDL Cholesterol Cal: 11 mg/dL (ref 5–40)

## 2022-12-30 LAB — HEMOGLOBIN A1C
Est. average glucose Bld gHb Est-mCnc: 97 mg/dL
Hgb A1c MFr Bld: 5 % (ref 4.8–5.6)

## 2022-12-30 LAB — TSH+FREE T4
Free T4: 1.22 ng/dL (ref 0.82–1.77)
TSH: 2.93 u[IU]/mL (ref 0.450–4.500)

## 2023-01-10 ENCOUNTER — Ambulatory Visit: Payer: Commercial Managed Care - PPO | Admitting: Family Medicine

## 2023-01-26 ENCOUNTER — Ambulatory Visit (HOSPITAL_COMMUNITY)
Admission: RE | Admit: 2023-01-26 | Discharge: 2023-01-26 | Disposition: A | Discharge status: Home / Self Care | Payer: Commercial Managed Care - PPO | Source: Ambulatory Visit | Attending: Family Medicine | Admitting: Family Medicine

## 2023-01-26 DIAGNOSIS — M7989 Other specified soft tissue disorders: Secondary | ICD-10-CM | POA: Diagnosis not present

## 2023-01-28 ENCOUNTER — Ambulatory Visit: Payer: Commercial Managed Care - PPO | Admitting: Family Medicine

## 2023-08-23 ENCOUNTER — Other Ambulatory Visit: Payer: Self-pay | Admitting: Orthopaedic Surgery

## 2023-08-23 DIAGNOSIS — M419 Scoliosis, unspecified: Secondary | ICD-10-CM

## 2023-08-23 DIAGNOSIS — M545 Low back pain, unspecified: Secondary | ICD-10-CM

## 2023-08-23 DIAGNOSIS — M546 Pain in thoracic spine: Secondary | ICD-10-CM

## 2023-09-02 NOTE — Discharge Instructions (Signed)

## 2023-09-05 ENCOUNTER — Ambulatory Visit
Admission: RE | Admit: 2023-09-05 | Discharge: 2023-09-05 | Disposition: A | Discharge status: Home / Self Care | Payer: Commercial Managed Care - PPO | Source: Ambulatory Visit | Attending: Orthopaedic Surgery | Admitting: Orthopaedic Surgery

## 2023-09-05 DIAGNOSIS — M545 Low back pain, unspecified: Secondary | ICD-10-CM

## 2023-09-05 DIAGNOSIS — M419 Scoliosis, unspecified: Secondary | ICD-10-CM

## 2023-09-05 DIAGNOSIS — M546 Pain in thoracic spine: Secondary | ICD-10-CM

## 2023-09-05 MED ORDER — ONDANSETRON HCL 4 MG/2ML IJ SOLN: 4.0000 mg | Freq: Once | INTRAMUSCULAR | Status: DC | PRN

## 2023-09-05 MED ORDER — MEPERIDINE HCL 50 MG/ML IJ SOLN: 50.0000 mg | Freq: Once | INTRAMUSCULAR | Status: DC | PRN

## 2023-09-05 MED ORDER — IOPAMIDOL (ISOVUE-M 300) INJECTION 61%
10.0000 mL | Freq: Once | INTRAMUSCULAR | Status: AC
  Administered 2023-09-05: 10 mL via INTRATHECAL

## 2023-09-05 MED ORDER — DIAZEPAM 5 MG PO TABS: 10.0000 mg | ORAL_TABLET | Freq: Once | ORAL | Status: DC

## 2023-09-08 ENCOUNTER — Ambulatory Visit
Admission: RE | Admit: 2023-09-08 | Discharge: 2023-09-08 | Disposition: A | Discharge status: Home / Self Care | Payer: Commercial Managed Care - PPO | Source: Ambulatory Visit | Attending: Orthopaedic Surgery | Admitting: Orthopaedic Surgery

## 2023-09-08 ENCOUNTER — Other Ambulatory Visit: Payer: Self-pay | Admitting: Orthopaedic Surgery

## 2023-09-08 DIAGNOSIS — G971 Other reaction to spinal and lumbar puncture: Secondary | ICD-10-CM

## 2023-09-08 MED ORDER — IOPAMIDOL (ISOVUE-M 200) INJECTION 41%
1.0000 mL | Freq: Once | INTRAMUSCULAR | Status: AC
  Administered 2023-09-08: 1 mL via EPIDURAL

## 2023-09-08 NOTE — Progress Notes (Signed)
20cc blood collected from pts RAC prior to blood patch procedure. Pt tolerated well. 1 successful attempt. Gauze and tape applied after. 

## 2023-09-08 NOTE — Discharge Instructions (Signed)
 Blood Patch Discharge Instructions ? ?Go home and rest quietly for the next 24 hours.  It is important to lie flat for the next 24 hours.  Get up only to go to the restroom.  You may lie in the bed or on a couch on your back, your stomach, your left side or your right side.  You may have one pillow under your head.  You may have pillows between your knees while you are on your side or under your knees while you are on your back. ? ?DO NOT drive today.  Recline the seat as far back as it will go, while still wearing your seat belt, on the way home. ? ?You may get up to go to the bathroom as needed.  You may sit up for 10 minutes to eat.  You may resume your normal diet and medications unless otherwise indicated.  Drink lots of extra fluids today and tomorrow..  ? ?You may resume normal activities after your 24 hours of bed rest is over; however, do not exert yourself strongly or do any heavy lifting tomorrow. ? ?Call your physician for a follow-up appointment.  ? ?If you have any questions  after you arrive home, please call [phone]. ? ?Discharge instructions have been explained to the patient.  The patient, or the person responsible for the patient, fully understands these instructions. ? ?   ?

## 2024-08-01 ENCOUNTER — Other Ambulatory Visit: Payer: Self-pay | Admitting: Medical Genetics

## 2024-08-17 ENCOUNTER — Other Ambulatory Visit (HOSPITAL_COMMUNITY)

## 2024-10-04 ENCOUNTER — Encounter: Admitting: Obstetrics & Gynecology
